# Patient Record
Sex: Female | Born: 1990 | Race: Black or African American | Hispanic: No | Marital: Single | Smoking: Current every day smoker
Health system: Southern US, Community
[De-identification: ages and names within clinical notes are randomized; demographics above are authoritative.]

## PROBLEM LIST (undated history)

## (undated) DIAGNOSIS — J45909 Unspecified asthma, uncomplicated: Secondary | ICD-10-CM

## (undated) DIAGNOSIS — J40 Bronchitis, not specified as acute or chronic: Secondary | ICD-10-CM

## (undated) DIAGNOSIS — K5090 Crohn's disease, unspecified, without complications: Secondary | ICD-10-CM

## (undated) DIAGNOSIS — C50919 Malignant neoplasm of unspecified site of unspecified female breast: Secondary | ICD-10-CM

## (undated) HISTORY — PX: PORTA CATH INSERTION: CATH118285

---

## 2014-06-25 ENCOUNTER — Encounter (HOSPITAL_BASED_OUTPATIENT_CLINIC_OR_DEPARTMENT_OTHER): Payer: Self-pay

## 2014-06-25 ENCOUNTER — Emergency Department (HOSPITAL_BASED_OUTPATIENT_CLINIC_OR_DEPARTMENT_OTHER)
Admission: EM | Admit: 2014-06-25 | Discharge: 2014-06-25 | Disposition: A | Discharge status: Home / Self Care | Payer: Medicaid Other | Attending: Emergency Medicine | Admitting: Emergency Medicine

## 2014-06-25 DIAGNOSIS — Z3A01 Less than 8 weeks gestation of pregnancy: Secondary | ICD-10-CM | POA: Insufficient documentation

## 2014-06-25 DIAGNOSIS — O99331 Smoking (tobacco) complicating pregnancy, first trimester: Secondary | ICD-10-CM | POA: Insufficient documentation

## 2014-06-25 DIAGNOSIS — F17210 Nicotine dependence, cigarettes, uncomplicated: Secondary | ICD-10-CM | POA: Diagnosis not present

## 2014-06-25 DIAGNOSIS — O2341 Unspecified infection of urinary tract in pregnancy, first trimester: Secondary | ICD-10-CM | POA: Diagnosis not present

## 2014-06-25 DIAGNOSIS — O9989 Other specified diseases and conditions complicating pregnancy, childbirth and the puerperium: Secondary | ICD-10-CM | POA: Diagnosis present

## 2014-06-25 DIAGNOSIS — N390 Urinary tract infection, site not specified: Secondary | ICD-10-CM

## 2014-06-25 LAB — URINALYSIS, ROUTINE W REFLEX MICROSCOPIC
BILIRUBIN URINE: NEGATIVE
Glucose, UA: NEGATIVE mg/dL
Ketones, ur: NEGATIVE mg/dL
Nitrite: NEGATIVE
PH: 7 (ref 5.0–8.0)
Protein, ur: 30 mg/dL — AB
SPECIFIC GRAVITY, URINE: 1.024 (ref 1.005–1.030)
Urobilinogen, UA: 1 mg/dL (ref 0.0–1.0)

## 2014-06-25 LAB — PREGNANCY, URINE: Preg Test, Ur: POSITIVE — AB

## 2014-06-25 LAB — HCG, QUANTITATIVE, PREGNANCY: hCG, Beta Chain, Quant, S: 50 m[IU]/mL — ABNORMAL HIGH (ref <5)

## 2014-06-25 LAB — URINE MICROSCOPIC-ADD ON

## 2014-06-25 MED ORDER — PHENAZOPYRIDINE HCL 100 MG PO TABS
100.0000 mg | ORAL_TABLET | Freq: Once | ORAL | Status: AC
  Administered 2014-06-25: 100 mg via ORAL
  Filled 2014-06-25: qty 1

## 2014-06-25 MED ORDER — PHENAZOPYRIDINE HCL 200 MG PO TABS: 200.0000 mg | ORAL_TABLET | Freq: Three times a day (TID) | ORAL | Status: DC

## 2014-06-25 MED ORDER — NITROFURANTOIN MONOHYD MACRO 100 MG PO CAPS
100.0000 mg | ORAL_CAPSULE | Freq: Once | ORAL | Status: AC
  Administered 2014-06-25: 100 mg via ORAL
  Filled 2014-06-25: qty 1

## 2014-06-25 MED ORDER — NITROFURANTOIN MONOHYD MACRO 100 MG PO CAPS: 100.0000 mg | ORAL_CAPSULE | Freq: Two times a day (BID) | ORAL | Status: DC

## 2014-06-25 NOTE — ED Notes (Signed)
Presents with urinary frequency for the past 24 hours, pt states worse today.

## 2014-06-25 NOTE — ED Notes (Signed)
Patient here with mild dysuria and urinary frequency x 2 days. Denies abdominal and back pain

## 2014-06-25 NOTE — Discharge Instructions (Signed)
Recheck with your primary care physician in 3-5 days, for additional pregnancy test. If your pregnancy test remains positive, please be reevaluated in emergency room, or with GYN.   Urinary Tract Infection Urinary tract infections (UTIs) can develop anywhere along your urinary tract. Your urinary tract is your body's drainage system for removing wastes and extra water. Your urinary tract includes two kidneys, two ureters, a bladder, and a urethra. Your kidneys are a pair of bean-shaped organs. Each kidney is about the size of your fist. They are located below your ribs, one on each side of your spine. CAUSES Infections are caused by microbes, which are microscopic organisms, including fungi, viruses, and bacteria. These organisms are so small that they can only be seen through a microscope. Bacteria are the microbes that most commonly cause UTIs. SYMPTOMS  Symptoms of UTIs may vary by age and gender of the patient and by the location of the infection. Symptoms in young women typically include a frequent and intense urge to urinate and a painful, burning feeling in the bladder or urethra during urination. Older women and men are more likely to be tired, shaky, and weak and have muscle aches and abdominal pain. A fever may mean the infection is in your kidneys. Other symptoms of a kidney infection include pain in your back or sides below the ribs, nausea, and vomiting. DIAGNOSIS To diagnose a UTI, your caregiver will ask you about your symptoms. Your caregiver also will ask to provide a urine sample. The urine sample will be tested for bacteria and white blood cells. White blood cells are made by your body to help fight infection. TREATMENT  Typically, UTIs can be treated with medication. Because most UTIs are caused by a bacterial infection, they usually can be treated with the use of antibiotics. The choice of antibiotic and length of treatment depend on your symptoms and the type of bacteria causing  your infection. HOME CARE INSTRUCTIONS  If you were prescribed antibiotics, take them exactly as your caregiver instructs you. Finish the medication even if you feel better after you have only taken some of the medication.  Drink enough water and fluids to keep your urine clear or pale yellow.  Avoid caffeine, tea, and carbonated beverages. They tend to irritate your bladder.  Empty your bladder often. Avoid holding urine for long periods of time.  Empty your bladder before and after sexual intercourse.  After a bowel movement, women should cleanse from front to back. Use each tissue only once. SEEK MEDICAL CARE IF:   You have back pain.  You develop a fever.  Your symptoms do not begin to resolve within 3 days. SEEK IMMEDIATE MEDICAL CARE IF:   You have severe back pain or lower abdominal pain.  You develop chills.  You have nausea or vomiting.  You have continued burning or discomfort with urination. MAKE SURE YOU:   Understand these instructions.  Will watch your condition.  Will get help right away if you are not doing well or get worse. Document Released: 10/30/2004 Document Revised: 07/22/2011 Document Reviewed: 02/28/2011 [facility] Patient Information 2015 [place], [place]. This information is not intended to replace advice given to you by your health care provider. Make sure you discuss any questions you have with your health care provider.

## 2014-06-25 NOTE — ED Provider Notes (Signed)
CSN: [id number]     Arrival date & time 06/25/14  1058 History   First MD Initiated Contact with Patient 06/25/14 1131     Chief Complaint  Patient presents with  . Dysuria      HPI  Patient presents for evaluation of "UTI". She states she's had burning with urination and frequency over the last 2-3 days "like every at the time of had an infection". When I inquire about possible pregnancy, bleeding, pain, fever, nausea, flank pain patient really doesn't respond to my questioning, she continues to just state "UTI".  However, when I state that her patient test is positive she becomes more attentive, actually moves the blanket from over her head, and will answer my questions. She states she had a therapeutic AB 426. Have bleeding for several weeks and has simple spotting now. Her other urinary symptoms and present for 2-3 days.s  History reviewed. No pertinent past medical history. History reviewed. No pertinent past surgical history. No family history on file. History  Substance Use Topics  . Smoking status: Current Every Day Smoker    Types: Cigarettes  . Smokeless tobacco: Not on file  . Alcohol Use: Not on file   OB History    No data available     Review of Systems  Constitutional: Negative for fever, chills, diaphoresis, appetite change and fatigue.  HENT: Negative for mouth sores, sore throat and trouble swallowing.   Eyes: Negative for visual disturbance.  Respiratory: Negative for cough, chest tightness, shortness of breath and wheezing.   Cardiovascular: Negative for chest pain.  Gastrointestinal: Negative for nausea, vomiting, abdominal pain, diarrhea and abdominal distention.  Endocrine: Negative for polydipsia, polyphagia and polyuria.  Genitourinary: Positive for dysuria, frequency and vaginal bleeding. Negative for hematuria.  Musculoskeletal: Negative for gait problem.  Skin: Negative for color change, pallor and rash.  Neurological: Negative for dizziness,  syncope, light-headedness and headaches.  Hematological: Does not bruise/bleed easily.  Psychiatric/Behavioral: Negative for behavioral problems and confusion.      Allergies  Bactrim  Home Medications   Prior to Admission medications   Medication Sig Start Date End Date Taking? Authorizing Provider  nitrofurantoin, macrocrystal-monohydrate, (MACROBID) 100 MG capsule Take 1 capsule (100 mg total) by mouth 2 (two) times daily. 06/25/14   Tanna Furry, MD  phenazopyridine (PYRIDIUM) 200 MG tablet Take 1 tablet (200 mg total) by mouth 3 (three) times daily. 06/25/14   Tanna Furry, MD   BP 117/77 mmHg  Pulse 93  Temp(Src) 98 F (36.7 C) (Oral)  Resp 18  Ht 5' 3"  (1.6 m)  Wt 175 lb (79.379 kg)  BMI 31.01 kg/m2  SpO2 99% Physical Exam  Constitutional: She is oriented to person, place, and time. She appears well-developed and well-nourished. No distress.  HENT:  Head: Normocephalic.  Eyes: Conjunctivae are normal. Pupils are equal, round, and reactive to light. No scleral icterus.  Neck: Normal range of motion. Neck supple. No thyromegaly present.  Cardiovascular: Normal rate and regular rhythm.  Exam reveals no gallop and no friction rub.   No murmur heard. Pulmonary/Chest: Effort normal and breath sounds normal. No respiratory distress. She has no wheezes. She has no rales.  Abdominal: Soft. Bowel sounds are normal. She exhibits no distension. There is no tenderness. There is no rebound.  Abdomen is soft, nondistended, nontender.  Musculoskeletal: Normal range of motion.  Neurological: She is alert and oriented to person, place, and time.  Skin: Skin is warm and dry. No rash noted.  Psychiatric:  She has a normal mood and affect. Her behavior is normal.    ED Course  Procedures (including critical care time) Labs Review Labs Reviewed  URINALYSIS, ROUTINE W REFLEX MICROSCOPIC - Abnormal; Notable for the following:    APPearance CLOUDY (*)    Hgb urine dipstick LARGE (*)     Protein, ur 30 (*)    Leukocytes, UA LARGE (*)    All other components within normal limits  PREGNANCY, URINE - Abnormal; Notable for the following:    Preg Test, Ur POSITIVE (*)    All other components within normal limits  URINE MICROSCOPIC-ADD ON - Abnormal; Notable for the following:    Bacteria, UA MANY (*)    All other components within normal limits  HCG, QUANTITATIVE, PREGNANCY - Abnormal; Notable for the following:    hCG, Beta Chain, Quant, S 50 (*)    All other components within normal limits    Imaging Review No results found.   EKG Interpretation None      MDM   Final diagnoses:  UTI (lower urinary tract infection)    Patient test is positive. Quant is 50. Had therapeutic AB 24 days ago. She states she was between 2 and 3 months at the time. Serum quantitative is 50. Theoretically this is a decreasing hCG from her recent therapeutic AB. She declines ultrasound or pelvic exam today. I will treat her for her UTI. A vascular repeat basic testing with her primary care physician in 3-5 days and to be reevaluated with GYN or in ER she develops pain, bleeding, or as her test remains positive.    Tanna Furry, MD 06/25/14 [phone]

## 2014-12-13 ENCOUNTER — Emergency Department (HOSPITAL_BASED_OUTPATIENT_CLINIC_OR_DEPARTMENT_OTHER)
Admission: EM | Admit: 2014-12-13 | Discharge: 2014-12-14 | Disposition: A | Discharge status: Home / Self Care | Payer: Medicaid Other | Attending: Emergency Medicine | Admitting: Emergency Medicine

## 2014-12-13 ENCOUNTER — Encounter (HOSPITAL_BASED_OUTPATIENT_CLINIC_OR_DEPARTMENT_OTHER): Payer: Self-pay | Admitting: *Deleted

## 2014-12-13 DIAGNOSIS — R1013 Epigastric pain: Secondary | ICD-10-CM | POA: Diagnosis present

## 2014-12-13 DIAGNOSIS — R05 Cough: Secondary | ICD-10-CM | POA: Diagnosis not present

## 2014-12-13 DIAGNOSIS — R102 Pelvic and perineal pain: Secondary | ICD-10-CM | POA: Insufficient documentation

## 2014-12-13 DIAGNOSIS — Z8742 Personal history of other diseases of the female genital tract: Secondary | ICD-10-CM | POA: Diagnosis not present

## 2014-12-13 DIAGNOSIS — Z720 Tobacco use: Secondary | ICD-10-CM | POA: Diagnosis not present

## 2014-12-13 DIAGNOSIS — Z8709 Personal history of other diseases of the respiratory system: Secondary | ICD-10-CM | POA: Insufficient documentation

## 2014-12-13 DIAGNOSIS — Z3202 Encounter for pregnancy test, result negative: Secondary | ICD-10-CM | POA: Diagnosis not present

## 2014-12-13 LAB — URINALYSIS, ROUTINE W REFLEX MICROSCOPIC
BILIRUBIN URINE: NEGATIVE
GLUCOSE, UA: NEGATIVE mg/dL
HGB URINE DIPSTICK: NEGATIVE
Ketones, ur: NEGATIVE mg/dL
Leukocytes, UA: NEGATIVE
Nitrite: NEGATIVE
PH: 6.5 (ref 5.0–8.0)
Protein, ur: NEGATIVE mg/dL
SPECIFIC GRAVITY, URINE: 1.023 (ref 1.005–1.030)
UROBILINOGEN UA: 0.2 mg/dL (ref 0.0–1.0)

## 2014-12-13 LAB — PREGNANCY, URINE: Preg Test, Ur: NEGATIVE

## 2014-12-13 NOTE — ED Notes (Signed)
C/o abd pain x 11 hours,  Denies n/v, no diarrhea,  No buring w urination,  States slight vad dc

## 2014-12-13 NOTE — ED Provider Notes (Signed)
CSN: [id number]     Arrival date & time 12/13/14  2319 History  By signing my name below, I, Arianna Nassar, attest that this documentation has been prepared under the direction and in the presence of Charlesetta Shanks, MD. Electronically Signed: Julien Nordmann, ED Scribe. 12/14/2014. 12:02 AM.    Chief Complaint  Patient presents with  . Abdominal Pain      The history is provided by the patient. No language interpreter was used.   HPI Comments: Jeanette Hines is a 24 y.o. female who presents to the Emergency Department complaining of constant, gradual worsening epigastric and suprapubic lower abdominal pain onset 12 hours ago. She has an associated productive cough due to a recent diagnosis of bronchitis. Pt notes using her inhaler earlier today to help alleviate the symptoms with minimal relief. Pt is currently sexually active, not on birth control and she has not missed a period. Pt denies nausea, vomiting, diarrhea, vaginal discharge, vaginal bleeding, dysuria, hematuria, back pain and fever.  History reviewed. No pertinent past medical history. History reviewed. No pertinent past surgical history. History reviewed. No pertinent family history. Social History  Substance Use Topics  . Smoking status: Current Every Day Smoker -- 0.50 packs/day    Types: Cigarettes  . Smokeless tobacco: None  . Alcohol Use: No   OB History    No data available     Review of Systems  10 Systems reviewed and are negative for acute change except as noted in the HPI.   Allergies  Bactrim  Home Medications   Prior to Admission medications   Medication Sig Start Date End Date Taking? Authorizing Provider  ibuprofen (ADVIL,MOTRIN) 800 MG tablet Take 1 tablet (800 mg total) by mouth 3 (three) times daily. 12/14/14   Charlesetta Shanks, MD   Triage vitals: BP 130/77 mmHg  Pulse 104  Temp(Src) 100 F (37.8 C)  Resp 16  Ht 5' 2"  (1.575 m)  Wt 184 lb (83.462 kg)  BMI 33.65 kg/m2  SpO2 100%  LMP  11/22/2014 Physical Exam  Constitutional: She is oriented to person, place, and time. She appears well-developed and well-nourished.  HENT:  Head: Normocephalic and atraumatic.  Eyes: EOM are normal. Pupils are equal, round, and reactive to light.  Neck: Neck supple.  Cardiovascular: Normal rate, regular rhythm, normal heart sounds and intact distal pulses.   Pulmonary/Chest: Effort normal and breath sounds normal.  Abdominal: Soft. Bowel sounds are normal. She exhibits no distension. There is no tenderness.  Genitourinary:  Normal external female genitalia. Back examination, moderate amount of whitish yellow discharge in the vaginal vault. Cervix without any bleeding or friability. Bimanual examination positive for cervical motion tenderness. Patient also endorses uterine tenderness to palpation. No pain over the right ovary to palpation. Patient denies pain higher once palpating out of the adnexa.  Musculoskeletal: Normal range of motion. She exhibits no edema.  Neurological: She is alert and oriented to person, place, and time. She has normal strength. Coordination normal. GCS eye subscore is 4. GCS verbal subscore is 5. GCS motor subscore is 6.  Skin: Skin is warm, dry and intact.  Psychiatric: She has a normal mood and affect.    ED Course  Procedures  DIAGNOSTIC STUDIES: Oxygen Saturation is 100% on RA, normal by my interpretation.  COORDINATION OF CARE:  12:01 AM Discussed treatment plan with pt at bedside and pt agreed to plan.  Labs Review Labs Reviewed  URINALYSIS, ROUTINE W REFLEX MICROSCOPIC (NOT AT [facility]) - Abnormal; Notable for  the following:    APPearance CLOUDY (*)    All other components within normal limits  WET PREP, GENITAL  PREGNANCY, URINE  GC/CHLAMYDIA PROBE AMP ([place]) NOT AT [facility]    Imaging Review No results found. I have personally reviewed and evaluated these images and lab results as part of my medical decision-making.   EKG  Interpretation None      MDM   Final diagnoses:  Pelvic pain in female   Patient presents with lower abdominal pain. On pelvic examination this does localize to the uterus and right adnexa. Patient does report a history of ovarian cysts with similar feeling. She also reports recent URI for which she is taking Tessalon Perle and inhaler. She does have clear pulmonary examination no signs rest. Distress. At this time I did not feel further diagnostic evaluation was needed for her respiratory symptoms. Her treatment was initiated based on a visit to [facility] within the past 2 days she reports. Her pelvic pain will be treated for possible cervicitis with tenderness and positive sexual activity. On bimanual examination the pain does localize to the pelvic structures and does not suggest appendicitis or other abdominal etiology. The patient poor she does have a gynecologist and is instructed to follow up next week for recheck.  Charlesetta Shanks, MD 12/14/14 [phone]

## 2014-12-13 NOTE — ED Notes (Signed)
Pt c/o lower abd pain x 12 hrs denies n/v/d

## 2014-12-14 LAB — GC/CHLAMYDIA PROBE AMP (~~LOC~~) NOT AT ARMC
CHLAMYDIA, DNA PROBE: POSITIVE — AB
Neisseria Gonorrhea: NEGATIVE

## 2014-12-14 LAB — WET PREP, GENITAL
Trich, Wet Prep: NONE SEEN
Yeast Wet Prep HPF POC: NONE SEEN

## 2014-12-14 MED ORDER — LIDOCAINE HCL (PF) 1 % IJ SOLN
INTRAMUSCULAR | Status: AC
  Administered 2014-12-14: 1.5 mL
  Filled 2014-12-14: qty 5

## 2014-12-14 MED ORDER — CEFTRIAXONE SODIUM 250 MG IJ SOLR
250.0000 mg | Freq: Once | INTRAMUSCULAR | Status: AC
  Administered 2014-12-14: 250 mg via INTRAMUSCULAR
  Filled 2014-12-14: qty 250

## 2014-12-14 MED ORDER — IBUPROFEN 800 MG PO TABS
800.0000 mg | ORAL_TABLET | Freq: Once | ORAL | Status: AC
  Administered 2014-12-14: 800 mg via ORAL
  Filled 2014-12-14: qty 1

## 2014-12-14 MED ORDER — AZITHROMYCIN 1 G PO PACK
1.0000 g | PACK | Freq: Once | ORAL | Status: AC
  Administered 2014-12-14: 1 g via ORAL
  Filled 2014-12-14: qty 1

## 2014-12-14 MED ORDER — IBUPROFEN 800 MG PO TABS: 800.0000 mg | ORAL_TABLET | Freq: Three times a day (TID) | ORAL | Status: DC

## 2014-12-14 NOTE — Discharge Instructions (Signed)
Pelvic Pain, Female Female pelvic pain can be caused by many different things and start from a variety of places. Pelvic pain refers to pain that is located in the lower half of the abdomen and between your hips. The pain may occur over a short period of time (acute) or may be reoccurring (chronic). The cause of pelvic pain may be related to disorders affecting the female reproductive organs (gynecologic), but it may also be related to the bladder, kidney stones, an intestinal complication, or muscle or skeletal problems. Getting help right away for pelvic pain is important, especially if there has been severe, sharp, or a sudden onset of unusual pain. It is also important to get help right away because some types of pelvic pain can be life threatening.  CAUSES  Below are only some of the causes of pelvic pain. The causes of pelvic pain can be in one of several categories.   Gynecologic.  Pelvic inflammatory disease.  Sexually transmitted infection.  Ovarian cyst or a twisted ovarian ligament (ovarian torsion).  Uterine lining that grows outside the uterus (endometriosis).  Fibroids, cysts, or tumors.  Ovulation.  Pregnancy.  Pregnancy that occurs outside the uterus (ectopic pregnancy).  Miscarriage.  Labor.  Abruption of the placenta or ruptured uterus.  Infection.  Uterine infection (endometritis).  Bladder infection.  Diverticulitis.  Miscarriage related to a uterine infection (septic abortion).  Bladder.  Inflammation of the bladder (cystitis).  Kidney stone(s).  Gastrointestinal.  Constipation.  Diverticulitis.  Neurologic.  Trauma.  Feeling pelvic pain because of mental or emotional causes (psychosomatic).  Cancers of the bowel or pelvis. EVALUATION  Your caregiver will want to take a careful history of your concerns. This includes recent changes in your health, a careful gynecologic history of your periods (menses), and a sexual history. Obtaining  your family history and medical history is also important. Your caregiver may suggest a pelvic exam. A pelvic exam will help identify the location and severity of the pain. It also helps in the evaluation of which organ system may be involved. In order to identify the cause of the pelvic pain and be properly treated, your caregiver may order tests. These tests may include:   A pregnancy test.  Pelvic ultrasonography.  An X-ray exam of the abdomen.  A urinalysis or evaluation of vaginal discharge.  Blood tests. HOME CARE INSTRUCTIONS   Only take over-the-counter or prescription medicines for pain, discomfort, or fever as directed by your caregiver.   Rest as directed by your caregiver.   Eat a balanced diet.   Drink enough fluids to make your urine clear or pale yellow, or as directed.   Avoid sexual intercourse if it causes pain.   Apply warm or cold compresses to the lower abdomen depending on which one helps the pain.   Avoid stressful situations.   Keep a journal of your pelvic pain. Write down when it started, where the pain is located, and if there are things that seem to be associated with the pain, such as food or your menstrual cycle.  Follow up with your caregiver as directed.  SEEK MEDICAL CARE IF:  Your medicine does not help your pain.  You have abnormal vaginal discharge. SEEK IMMEDIATE MEDICAL CARE IF:   You have heavy bleeding from the vagina.   Your pelvic pain increases.   You feel light-headed or faint.   You have chills.   You have pain with urination or blood in your urine.   You have uncontrolled  diarrhea or vomiting.   You have a fever or persistent symptoms for more than 3 days.  You have a fever and your symptoms suddenly get worse.   You are being physically or sexually abused.   This information is not intended to replace advice given to you by your health care provider. Make sure you discuss any questions you have with  your health care provider.   Document Released: 12/18/2003 Document Revised: 10/11/2014 Document Reviewed: 05/12/2011 Elsevier Interactive Patient Education [employer].

## 2014-12-15 ENCOUNTER — Telehealth (HOSPITAL_BASED_OUTPATIENT_CLINIC_OR_DEPARTMENT_OTHER): Payer: Self-pay | Admitting: Emergency Medicine

## 2014-12-17 ENCOUNTER — Telehealth (HOSPITAL_BASED_OUTPATIENT_CLINIC_OR_DEPARTMENT_OTHER): Payer: Self-pay | Admitting: Emergency Medicine

## 2015-01-08 ENCOUNTER — Telehealth (HOSPITAL_COMMUNITY): Payer: Self-pay

## 2015-01-08 NOTE — Telephone Encounter (Signed)
Unable to reach by phone or mail.  Chart closed.

## 2015-04-28 ENCOUNTER — Encounter (HOSPITAL_BASED_OUTPATIENT_CLINIC_OR_DEPARTMENT_OTHER): Payer: Self-pay | Admitting: Emergency Medicine

## 2015-04-28 ENCOUNTER — Emergency Department (HOSPITAL_BASED_OUTPATIENT_CLINIC_OR_DEPARTMENT_OTHER)
Admission: EM | Admit: 2015-04-28 | Discharge: 2015-04-28 | Disposition: A | Discharge status: Home / Self Care | Payer: Medicaid Other | Attending: Emergency Medicine | Admitting: Emergency Medicine

## 2015-04-28 DIAGNOSIS — W503XXA Accidental bite by another person, initial encounter: Secondary | ICD-10-CM

## 2015-04-28 DIAGNOSIS — Z23 Encounter for immunization: Secondary | ICD-10-CM | POA: Diagnosis not present

## 2015-04-28 DIAGNOSIS — Y998 Other external cause status: Secondary | ICD-10-CM | POA: Diagnosis not present

## 2015-04-28 DIAGNOSIS — Z791 Long term (current) use of non-steroidal anti-inflammatories (NSAID): Secondary | ICD-10-CM | POA: Diagnosis not present

## 2015-04-28 DIAGNOSIS — Y9389 Activity, other specified: Secondary | ICD-10-CM | POA: Diagnosis not present

## 2015-04-28 DIAGNOSIS — F17210 Nicotine dependence, cigarettes, uncomplicated: Secondary | ICD-10-CM | POA: Diagnosis not present

## 2015-04-28 DIAGNOSIS — S6992XA Unspecified injury of left wrist, hand and finger(s), initial encounter: Secondary | ICD-10-CM | POA: Diagnosis present

## 2015-04-28 DIAGNOSIS — Y9289 Other specified places as the place of occurrence of the external cause: Secondary | ICD-10-CM | POA: Insufficient documentation

## 2015-04-28 DIAGNOSIS — S61452A Open bite of left hand, initial encounter: Secondary | ICD-10-CM | POA: Diagnosis not present

## 2015-04-28 MED ORDER — TETANUS-DIPHTH-ACELL PERTUSSIS 5-2.5-18.5 LF-MCG/0.5 IM SUSP
0.5000 mL | Freq: Once | INTRAMUSCULAR | Status: AC
  Administered 2015-04-28: 0.5 mL via INTRAMUSCULAR
  Filled 2015-04-28: qty 0.5

## 2015-04-28 MED ORDER — AMOXICILLIN-POT CLAVULANATE 875-125 MG PO TABS: 1.0000 | ORAL_TABLET | Freq: Two times a day (BID) | ORAL | Status: DC

## 2015-04-28 MED ORDER — AMOXICILLIN-POT CLAVULANATE 875-125 MG PO TABS
1.0000 | ORAL_TABLET | Freq: Once | ORAL | Status: AC
  Administered 2015-04-28: 1 via ORAL
  Filled 2015-04-28: qty 1
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
## 2015-08-25 ENCOUNTER — Emergency Department (HOSPITAL_BASED_OUTPATIENT_CLINIC_OR_DEPARTMENT_OTHER)
Admission: EM | Admit: 2015-08-25 | Discharge: 2015-08-25 | Disposition: A | Discharge status: Home / Self Care | Payer: Medicaid Other | Attending: Emergency Medicine | Admitting: Emergency Medicine

## 2015-08-25 ENCOUNTER — Encounter (HOSPITAL_BASED_OUTPATIENT_CLINIC_OR_DEPARTMENT_OTHER): Payer: Self-pay | Admitting: *Deleted

## 2015-08-25 DIAGNOSIS — F17210 Nicotine dependence, cigarettes, uncomplicated: Secondary | ICD-10-CM | POA: Insufficient documentation

## 2015-08-25 DIAGNOSIS — S39011A Strain of muscle, fascia and tendon of abdomen, initial encounter: Secondary | ICD-10-CM | POA: Diagnosis not present

## 2015-08-25 DIAGNOSIS — T148XXA Other injury of unspecified body region, initial encounter: Secondary | ICD-10-CM

## 2015-08-25 DIAGNOSIS — Y9289 Other specified places as the place of occurrence of the external cause: Secondary | ICD-10-CM | POA: Diagnosis not present

## 2015-08-25 DIAGNOSIS — Y9341 Activity, dancing: Secondary | ICD-10-CM | POA: Insufficient documentation

## 2015-08-25 DIAGNOSIS — Y998 Other external cause status: Secondary | ICD-10-CM | POA: Diagnosis not present

## 2015-08-25 DIAGNOSIS — X58XXXA Exposure to other specified factors, initial encounter: Secondary | ICD-10-CM | POA: Diagnosis not present

## 2015-08-25 DIAGNOSIS — R1031 Right lower quadrant pain: Secondary | ICD-10-CM | POA: Diagnosis present

## 2015-08-25 LAB — PREGNANCY, URINE: PREG TEST UR: NEGATIVE

## 2015-08-25 MED ORDER — NAPROXEN 500 MG PO TABS: 500.0000 mg | ORAL_TABLET | Freq: Two times a day (BID) | ORAL | Status: DC

## 2015-08-25 NOTE — ED Notes (Signed)
Pt reports right sided groin area pain since dancing all night on her [place] at the club. Pt reports pain increases with certain movements. Denies any vag d/c or other c/o.

## 2015-08-25 NOTE — Discharge Instructions (Signed)
Take your medication as prescribed as aunt for pain relief. You may also apply ice to affected area for 10-15 minutes 3-4 times daily for pain relief. Refrain from doing any repetitive movements or movement that exacerbate your pain for the next few days until your pain has improved. Follow-up with your family doctor in the next week if her symptoms have not improved. Return to the emergency department if symptoms worsen or new onset of fever, vomiting, diarrhea, blood in vomit or stool, unable to keep fluids down, new or worsening abdominal pain, vaginal bleeding, vaginal discharge.

## 2015-08-25 NOTE — ED Provider Notes (Signed)
CSN: [id number]     Arrival date & time 08/25/15  1412 History   First MD Initiated Contact with Jeanette Hines 08/25/15 1454     Chief Complaint  Jeanette Hines presents with  . Groin Pain     (Consider location/radiation/quality/duration/timing/severity/associated sxs/prior Treatment) HPI   Jeanette Hines is a 25 year old female with no pertinent past medical history who presents the ED with complaint of right groin pain, onset yesterday morning. Jeanette Hines reports she was out dancing all night 2 nights ago at a club and notes after getting home the following morning she began having sharp aching pain to her right groin. Jeanette Hines reports pain is worse with movement, bending or sitting up. Jeanette Hines denies any other known injury. Denies taking any medications prior to arrival. Denies fever, chills, abdominal pain, nausea, vomiting, diarrhea, vaginal discharge, vaginal bleeding, urinary symptoms, flank pain. LMP the end of June, pt reports she is scheduled to start her menstrual cycle in the next few days. Denies any history of abdominal surgeries.  History reviewed. No pertinent past medical history. History reviewed. No pertinent past surgical history. History reviewed. No pertinent family history. Social History  Substance Use Topics  . Smoking status: Current Every Day Smoker -- 0.50 packs/day    Types: Cigarettes  . Smokeless tobacco: None  . Alcohol Use: No   OB History    No data available     Review of Systems  Musculoskeletal:       Right groin pain  All other systems reviewed and are negative.     Allergies  Bactrim  Home Medications   Prior to Admission medications   Medication Sig Start Date End Date Taking? Authorizing Provider  amoxicillin-clavulanate (AUGMENTIN) 875-125 MG tablet Take 1 tablet by mouth every 12 (twelve) hours. 04/28/15   Okey Regal, PA-C  ibuprofen (ADVIL,MOTRIN) 800 MG tablet Take 1 tablet (800 mg total) by mouth 3 (three) times daily. 12/14/14   Charlesetta Shanks,  MD  naproxen (NAPROSYN) 500 MG tablet Take 1 tablet (500 mg total) by mouth 2 (two) times daily. 08/25/15   Chesley Noon Jonmichael Beadnell, PA-C   BP 110/82 mmHg  Pulse 91  Temp(Src) 98.9 F (37.2 C) (Oral)  Resp 18  Ht 5' 2"  (1.575 m)  Wt 80.74 kg  BMI 32.55 kg/m2  SpO2 100%  LMP 08/02/2015 Physical Exam  Constitutional: She is oriented to person, place, and time. She appears well-developed and well-nourished. No distress.  HENT:  Head: Normocephalic and atraumatic.  Mouth/Throat: Oropharynx is clear and moist. No oropharyngeal exudate.  Eyes: Conjunctivae and EOM are normal. Right eye exhibits no discharge. Left eye exhibits no discharge. No scleral icterus.  Neck: Normal range of motion. Neck supple.  Cardiovascular: Normal rate, regular rhythm, normal heart sounds and intact distal pulses.   Pulmonary/Chest: Effort normal and breath sounds normal. No respiratory distress. She has no wheezes. She has no rales. She exhibits no tenderness.  Abdominal: Soft. Bowel sounds are normal. She exhibits no distension and no mass. There is no tenderness. There is no rebound and no guarding.  Musculoskeletal: Normal range of motion. She exhibits tenderness. She exhibits no edema.  TTP over right anterior superior groin along iliopsoas and inferior transversus abdominis muscles. FROM of bilateral hips and BLE. No swelling, abrasion or contusion noted.  Neurological: She is alert and oriented to person, place, and time.  Skin: Skin is warm and dry. She is not diaphoretic.  Nursing note and vitals reviewed.   ED Course  Procedures (including critical care time)  Labs Review Labs Reviewed  PREGNANCY, URINE    Imaging Review No results found. I have personally reviewed and evaluated these images and lab results as part of my medical decision-making.   EKG Interpretation None      MDM   Final diagnoses:  Muscle strain    Jeanette Hines presents with right groin pain that occurs with movement or  sitting up. Pain started after dancing at a club 2 nights ago. Denies any other known injury. Denies any other associated symptoms. VSS. Exam revealed tenderness over right anterior superior groin along iliopsoas and inferior transverse abdominis muscles, no abdominal tenderness, no peritoneal signs. Bilateral lower extremities are vascular intact. Jeanette Hines able to stand and ambulate without assistance. I suspect Jeanette Hines's symptoms are likely due to muscle strain. Plan to discharge Jeanette Hines home with NSAIDs and symptomatically treatment. Discussed plan for discharge with Jeanette Hines. Advised her to follow up with PCP as needed if symptoms have not improved over the next week. Discussed strict return precautions with Jeanette Hines.    Chesley Noon [place], [place] 08/25/15 1654   Leonard Schwartz, MD 08/26/15 [phone]

## 2015-09-17 ENCOUNTER — Emergency Department (HOSPITAL_BASED_OUTPATIENT_CLINIC_OR_DEPARTMENT_OTHER)
Admission: EM | Admit: 2015-09-17 | Discharge: 2015-09-17 | Disposition: A | Discharge status: Home / Self Care | Payer: Medicaid Other | Attending: Emergency Medicine | Admitting: Emergency Medicine

## 2015-09-17 ENCOUNTER — Emergency Department (HOSPITAL_BASED_OUTPATIENT_CLINIC_OR_DEPARTMENT_OTHER): Payer: Medicaid Other

## 2015-09-17 ENCOUNTER — Encounter (HOSPITAL_BASED_OUTPATIENT_CLINIC_OR_DEPARTMENT_OTHER): Payer: Self-pay | Admitting: Emergency Medicine

## 2015-09-17 DIAGNOSIS — F17210 Nicotine dependence, cigarettes, uncomplicated: Secondary | ICD-10-CM | POA: Diagnosis not present

## 2015-09-17 DIAGNOSIS — N72 Inflammatory disease of cervix uteri: Secondary | ICD-10-CM

## 2015-09-17 DIAGNOSIS — R1031 Right lower quadrant pain: Secondary | ICD-10-CM | POA: Diagnosis present

## 2015-09-17 DIAGNOSIS — N760 Acute vaginitis: Secondary | ICD-10-CM | POA: Diagnosis not present

## 2015-09-17 DIAGNOSIS — R102 Pelvic and perineal pain: Secondary | ICD-10-CM

## 2015-09-17 DIAGNOSIS — B9689 Other specified bacterial agents as the cause of diseases classified elsewhere: Secondary | ICD-10-CM

## 2015-09-17 LAB — URINALYSIS, ROUTINE W REFLEX MICROSCOPIC
Glucose, UA: NEGATIVE mg/dL
HGB URINE DIPSTICK: NEGATIVE
KETONES UR: 40 mg/dL — AB
Leukocytes, UA: NEGATIVE
NITRITE: NEGATIVE
Protein, ur: NEGATIVE mg/dL
SPECIFIC GRAVITY, URINE: 1.025 (ref 1.005–1.030)
pH: 6 (ref 5.0–8.0)

## 2015-09-17 LAB — LIPASE, BLOOD: Lipase: 21 U/L (ref 11–51)

## 2015-09-17 LAB — COMPREHENSIVE METABOLIC PANEL
ALBUMIN: 3.8 g/dL (ref 3.5–5.0)
ALK PHOS: 100 U/L (ref 38–126)
ALT: 12 U/L — ABNORMAL LOW (ref 14–54)
AST: 15 U/L (ref 15–41)
Anion gap: 8 (ref 5–15)
BILIRUBIN TOTAL: 0.7 mg/dL (ref 0.3–1.2)
BUN: 9 mg/dL (ref 6–20)
CO2: 26 mmol/L (ref 22–32)
Calcium: 9.1 mg/dL (ref 8.9–10.3)
Chloride: 106 mmol/L (ref 101–111)
Creatinine, Ser: 0.93 mg/dL (ref 0.44–1.00)
GFR calc Af Amer: >60 mL/min (ref >60)
GFR calc non Af Amer: >60 mL/min (ref >60)
GLUCOSE: 87 mg/dL (ref 65–99)
Potassium: 3.6 mmol/L (ref 3.5–5.1)
SODIUM: 140 mmol/L (ref 135–145)
TOTAL PROTEIN: 8 g/dL (ref 6.5–8.1)

## 2015-09-17 LAB — CBC
HEMATOCRIT: 37.5 % (ref 36.0–46.0)
HEMOGLOBIN: 12.3 g/dL (ref 12.0–15.0)
MCH: 26.9 pg (ref 26.0–34.0)
MCHC: 32.8 g/dL (ref 30.0–36.0)
MCV: 82.1 fL (ref 78.0–100.0)
Platelets: 469 10*3/uL — ABNORMAL HIGH (ref 150–400)
RBC: 4.57 MIL/uL (ref 3.87–5.11)
RDW: 15.1 % (ref 11.5–15.5)
WBC: 6.2 10*3/uL (ref 4.0–10.5)

## 2015-09-17 LAB — WET PREP, GENITAL
Sperm: NONE SEEN
TRICH WET PREP: NONE SEEN
YEAST WET PREP: NONE SEEN

## 2015-09-17 LAB — PREGNANCY, URINE: Preg Test, Ur: NEGATIVE

## 2015-09-17 MED ORDER — CEFTRIAXONE SODIUM 250 MG IJ SOLR
250.0000 mg | Freq: Once | INTRAMUSCULAR | Status: AC
  Administered 2015-09-17: 250 mg via INTRAMUSCULAR
  Filled 2015-09-17: qty 250

## 2015-09-17 MED ORDER — METRONIDAZOLE 500 MG PO TABS: 500.0000 mg | ORAL_TABLET | Freq: Two times a day (BID) | ORAL | 0 refills | Status: DC

## 2015-09-17 MED ORDER — LIDOCAINE HCL (PF) 1 % IJ SOLN
INTRAMUSCULAR | Status: AC
  Administered 2015-09-17: 0.9 mL
  Filled 2015-09-17: qty 5

## 2015-09-17 MED ORDER — AZITHROMYCIN 250 MG PO TABS
1000.0000 mg | ORAL_TABLET | Freq: Once | ORAL | Status: AC
  Administered 2015-09-17: 1000 mg via ORAL
  Filled 2015-09-17: qty 4

## 2015-09-17 MED FILL — metroNIDAZOLE 500 MG TABS: 500 | 7 days supply | Qty: 14 | Fill #0

## 2015-09-17 NOTE — ED Provider Notes (Signed)
[facility] DEPT Provider Note   CSN: [id number] Arrival date & time: 09/17/15  0605  First Provider Contact:  None       History   Chief Complaint Chief Complaint  Patient presents with  . Abdominal Pain    HPI Jeanette Hines is a 25 y.o. female. Pt presenting with c/o ongoing right sided lower abdominal pain.  Pt states the pain has been intermittent over the past month- she states she was seen in the ED several weeks ago for the same pain.  Notes from that visit discuss groin pain, however patient states she has been having right sided abdominal pain.  It has come and gone twice and then returned again this morning.  No fever/chills.  No dysuria.  No vaginal discharge or bleeding.  No vomiting or change in stools.  Pain is worse with movement.  There are no other associated systemic symptoms, there are no other alleviating or modifying factors.   HPI  History reviewed. No pertinent past medical history.  There are no active problems to display for this patient.   History reviewed. No pertinent surgical history.  OB History    No data available       Home Medications    Prior to Admission medications   Medication Sig Start Date End Date Taking? Authorizing Provider  amoxicillin-clavulanate (AUGMENTIN) 875-125 MG tablet Take 1 tablet by mouth every 12 (twelve) hours. 04/28/15   Okey Regal, PA-C  ibuprofen (ADVIL,MOTRIN) 800 MG tablet Take 1 tablet (800 mg total) by mouth 3 (three) times daily. 12/14/14   Charlesetta Shanks, MD  metroNIDAZOLE (FLAGYL) 500 MG tablet Take 1 tablet (500 mg total) by mouth 2 (two) times daily. 09/17/15   Alfonzo Beers, MD  naproxen (NAPROSYN) 500 MG tablet Take 1 tablet (500 mg total) by mouth 2 (two) times daily. 08/25/15   Nona Dell, PA-C    Family History History reviewed. No pertinent family history.  Social History Social History  Substance Use Topics  . Smoking status: Current Every Day Smoker    Packs/day: 0.50      Types: Cigarettes  . Smokeless tobacco: Never Used  . Alcohol use No     Allergies   Bactrim [sulfamethoxazole-trimethoprim]   Review of Systems Review of Systems ROS reviewed and all otherwise negative except for mentioned in HPI  Physical Exam Updated Vital Signs BP 115/78   Pulse 68   Temp 98.6 F (37 C) (Oral)   Resp 16   Ht 5' 2"  (1.575 m)   Wt 80.3 kg   LMP 08/27/2015   SpO2 100%   BMI 32.37 kg/m  Vitals reviewed Physical Exam  Physical Examination: General appearance - alert, well appearing, and in no distress Mental status - alert, oriented to person, place, and time Eyes -no conjunctival injection, no scleral icterus Chest - clear to auscultation, no wheezes, rales or rhonchi, symmetric air entry Heart - normal rate, regular rhythm, normal S1, S2, no murmurs, rubs, clicks or gallops Abdomen - soft, nontender, nondistended, no masses or organomegaly Pelvic - normal external genitalia, vulva, vagina, cervix, uterus, no CMT, mild right adnexal tenderness, no masses Neurological - alert, oriented Extremities - peripheral pulses normal, no pedal edema, no clubbing or cyanosis Skin - normal coloration and turgor, no rashes   ED Treatments / Results  Labs (all labs ordered are listed, but only abnormal results are displayed) Labs Reviewed  WET PREP, GENITAL - Abnormal; Notable for the following:  Result Value   Clue Cells Wet Prep HPF POC PRESENT (*)    WBC, Wet Prep HPF POC MANY (*)    All other components within normal limits  COMPREHENSIVE METABOLIC PANEL - Abnormal; Notable for the following:    ALT 12 (*)    All other components within normal limits  CBC - Abnormal; Notable for the following:    Platelets 469 (*)    All other components within normal limits  URINALYSIS, ROUTINE W REFLEX MICROSCOPIC (NOT AT [facility]) - Abnormal; Notable for the following:    APPearance CLOUDY (*)    Bilirubin Urine SMALL (*)    Ketones, ur 40 (*)    All other  components within normal limits  LIPASE, BLOOD  PREGNANCY, URINE  HIV ANTIBODY (ROUTINE TESTING)  RPR  GC/CHLAMYDIA PROBE AMP ([place]) NOT AT [facility]    EKG  EKG Interpretation None       Radiology US Transvaginal Non-ob  Result Date: 09/17/2015 CLINICAL DATA:  Right lower quadrant abdominal pain for approximately 3 weeks. History of ovarian cyst. EXAM: TRANSABDOMINAL AND TRANSVAGINAL ULTRASOUND OF PELVIS DOPPLER ULTRASOUND OF OVARIES TECHNIQUE: Both transabdominal and transvaginal ultrasound examinations of the pelvis were performed. Transabdominal technique was performed for global imaging of the pelvis including uterus, ovaries, adnexal regions, and pelvic cul-de-sac. It was necessary to proceed with endovaginal exam following the transabdominal exam to visualize the endometrium and ovaries to better advantage. Color and duplex Doppler ultrasound was utilized to evaluate blood flow to the ovaries. COMPARISON:  Report only from pelvic ultrasound 01/18/2011. Abdominal pelvic CT 01/18/2011. FINDINGS: Uterus Measurements: 6.8 x 2.8 x 3.3 cm. No fibroids or other mass visualized. Endometrium Thickness: 6 mm.  No focal abnormality visualized. Right ovary Measurements: 3.3 x 1.6 x 1.9 cm. Probable small central collapsing follicle, measuring 1.5 cm maximally. Normal blood flow with color Doppler. Left ovary Measurements: 3.2 x 1.3 x 1.3 cm. Normal appearance/no adnexal mass. Normal blood flow with color Doppler. Pulsed Doppler evaluation of both ovaries demonstrates normal low-resistance arterial and venous waveforms. Other findings Small to moderate amount of free pelvic fluid. IMPRESSION: 1. No evidence of ovarian torsion. 2. Probable small simple collapsing right ovarian follicle. No suspicious adnexal findings. 3. Small to moderate amount of free pelvic fluid, nonspecific. Electronically Signed   By: Richardean Sale M.D.   On: 09/17/2015 11:20   US Pelvis Complete  Result Date:  09/17/2015 CLINICAL DATA:  Right lower quadrant abdominal pain for approximately 3 weeks. History of ovarian cyst. EXAM: TRANSABDOMINAL AND TRANSVAGINAL ULTRASOUND OF PELVIS DOPPLER ULTRASOUND OF OVARIES TECHNIQUE: Both transabdominal and transvaginal ultrasound examinations of the pelvis were performed. Transabdominal technique was performed for global imaging of the pelvis including uterus, ovaries, adnexal regions, and pelvic cul-de-sac. It was necessary to proceed with endovaginal exam following the transabdominal exam to visualize the endometrium and ovaries to better advantage. Color and duplex Doppler ultrasound was utilized to evaluate blood flow to the ovaries. COMPARISON:  Report only from pelvic ultrasound 01/18/2011. Abdominal pelvic CT 01/18/2011. FINDINGS: Uterus Measurements: 6.8 x 2.8 x 3.3 cm. No fibroids or other mass visualized. Endometrium Thickness: 6 mm.  No focal abnormality visualized. Right ovary Measurements: 3.3 x 1.6 x 1.9 cm. Probable small central collapsing follicle, measuring 1.5 cm maximally. Normal blood flow with color Doppler. Left ovary Measurements: 3.2 x 1.3 x 1.3 cm. Normal appearance/no adnexal mass. Normal blood flow with color Doppler. Pulsed Doppler evaluation of both ovaries demonstrates normal low-resistance arterial and venous waveforms. Other  findings Small to moderate amount of free pelvic fluid. IMPRESSION: 1. No evidence of ovarian torsion. 2. Probable small simple collapsing right ovarian follicle. No suspicious adnexal findings. 3. Small to moderate amount of free pelvic fluid, nonspecific. Electronically Signed   By: Richardean Sale M.D.   On: 09/17/2015 11:20   [place] Art/ven Flow Abd Pelv Doppler  Result Date: 09/17/2015 CLINICAL DATA:  Right lower quadrant abdominal pain for approximately 3 weeks. History of ovarian cyst. EXAM: TRANSABDOMINAL AND TRANSVAGINAL ULTRASOUND OF PELVIS DOPPLER ULTRASOUND OF OVARIES TECHNIQUE: Both transabdominal and transvaginal  ultrasound examinations of the pelvis were performed. Transabdominal technique was performed for global imaging of the pelvis including uterus, ovaries, adnexal regions, and pelvic cul-de-sac. It was necessary to proceed with endovaginal exam following the transabdominal exam to visualize the endometrium and ovaries to better advantage. Color and duplex Doppler ultrasound was utilized to evaluate blood flow to the ovaries. COMPARISON:  Report only from pelvic ultrasound 01/18/2011. Abdominal pelvic CT 01/18/2011. FINDINGS: Uterus Measurements: 6.8 x 2.8 x 3.3 cm. No fibroids or other mass visualized. Endometrium Thickness: 6 mm.  No focal abnormality visualized. Right ovary Measurements: 3.3 x 1.6 x 1.9 cm. Probable small central collapsing follicle, measuring 1.5 cm maximally. Normal blood flow with color Doppler. Left ovary Measurements: 3.2 x 1.3 x 1.3 cm. Normal appearance/no adnexal mass. Normal blood flow with color Doppler. Pulsed Doppler evaluation of both ovaries demonstrates normal low-resistance arterial and venous waveforms. Other findings Small to moderate amount of free pelvic fluid. IMPRESSION: 1. No evidence of ovarian torsion. 2. Probable small simple collapsing right ovarian follicle. No suspicious adnexal findings. 3. Small to moderate amount of free pelvic fluid, nonspecific. Electronically Signed   By: Richardean Sale M.D.   On: 09/17/2015 11:20    Procedures Procedures (including critical care time)  Medications Ordered in ED Medications  cefTRIAXone (ROCEPHIN) injection 250 mg (250 mg Intramuscular Given 09/17/15 1159)  azithromycin (ZITHROMAX) tablet 1,000 mg (1,000 mg Oral Given 09/17/15 1158)  lidocaine (PF) (XYLOCAINE) 1 % injection (0.9 mLs  Given 09/17/15 1200)     Initial Impression / Assessment and Plan / ED Course  I have reviewed the triage vital signs and the nursing notes.  Pertinent labs & imaging results that were available during my care of the patient were  reviewed by me and considered in my medical decision making (see chart for details).  Clinical Course    Pt presenting with right sided pelvic pain that has been ongoing for the past several weeks.  Pelvic ultrasound is reassuring, wet prep is c/w BV and cervicitis.  Pt treated with rocephin, zithromax and given rx for flagyl.  Pt is not pregnant, no sign of ovarian torsion/TOA or other acute abnormalities on ultrasound.  Discharged with strict return precautions.  Pt agreeable with plan.  Final Clinical Impressions(s) / ED Diagnoses   Final diagnoses:  Pelvic pain in female  Cervicitis  Bacterial vaginosis    New Prescriptions Discharge Medication List as of 09/17/2015 12:22 PM    START taking these medications   Details  metroNIDAZOLE (FLAGYL) 500 MG tablet Take 1 tablet (500 mg total) by mouth 2 (two) times daily., Starting Mon 09/17/2015, Print         Alfonzo Beers, MD 09/18/15 1022

## 2015-09-17 NOTE — ED Triage Notes (Addendum)
Patient states that she was seen here on the 22nd and was told that if she had pain to her right lower quad area. Patient states that she continues to have the pain after it went away x 1. The patient reports that she was unable to even walk due to the pain. Patient seen in no distress while walking to the room. Patient texting on phone while triage going on.  Patient reports that her lower abdominal area is throbbing and it hurts worse when she turns over, moves and sometimes when she is sitting still it just throbbs, Patient reports that she also has a rash to her right neck that she states appeared after she sprayed some new body spray.

## 2015-09-17 NOTE — Discharge Instructions (Signed)
Return to the ED with any concerns including fever, vomiting and not able to keep down liquids or medications, fainting, worsening pain, decreased level of alertness/lethargy, or any other alarming symptoms

## 2015-09-18 LAB — RPR: RPR Ser Ql: NONREACTIVE

## 2015-09-18 LAB — GC/CHLAMYDIA PROBE AMP (~~LOC~~) NOT AT ARMC
CHLAMYDIA, DNA PROBE: NEGATIVE
NEISSERIA GONORRHEA: NEGATIVE

## 2015-09-18 LAB — HIV ANTIBODY (ROUTINE TESTING W REFLEX): HIV Screen 4th Generation wRfx: NONREACTIVE

## 2016-12-16 ENCOUNTER — Encounter (HOSPITAL_BASED_OUTPATIENT_CLINIC_OR_DEPARTMENT_OTHER): Payer: Self-pay | Admitting: Emergency Medicine

## 2016-12-16 ENCOUNTER — Other Ambulatory Visit: Payer: Self-pay

## 2016-12-16 ENCOUNTER — Emergency Department (HOSPITAL_BASED_OUTPATIENT_CLINIC_OR_DEPARTMENT_OTHER)
Admission: EM | Admit: 2016-12-16 | Discharge: 2016-12-16 | Disposition: A | Discharge status: Home / Self Care | Payer: Medicaid Other | Attending: Physician Assistant | Admitting: Physician Assistant

## 2016-12-16 DIAGNOSIS — B9689 Other specified bacterial agents as the cause of diseases classified elsewhere: Secondary | ICD-10-CM

## 2016-12-16 DIAGNOSIS — Z79899 Other long term (current) drug therapy: Secondary | ICD-10-CM | POA: Insufficient documentation

## 2016-12-16 DIAGNOSIS — N760 Acute vaginitis: Secondary | ICD-10-CM | POA: Insufficient documentation

## 2016-12-16 DIAGNOSIS — F17210 Nicotine dependence, cigarettes, uncomplicated: Secondary | ICD-10-CM | POA: Insufficient documentation

## 2016-12-16 DIAGNOSIS — A5901 Trichomonal vulvovaginitis: Secondary | ICD-10-CM | POA: Diagnosis not present

## 2016-12-16 DIAGNOSIS — A599 Trichomoniasis, unspecified: Secondary | ICD-10-CM

## 2016-12-16 DIAGNOSIS — N898 Other specified noninflammatory disorders of vagina: Secondary | ICD-10-CM | POA: Diagnosis present

## 2016-12-16 LAB — URINALYSIS, ROUTINE W REFLEX MICROSCOPIC
BILIRUBIN URINE: NEGATIVE
Glucose, UA: NEGATIVE mg/dL
KETONES UR: NEGATIVE mg/dL
NITRITE: NEGATIVE
Protein, ur: NEGATIVE mg/dL
Specific Gravity, Urine: 1.025 (ref 1.005–1.030)
pH: 6.5 (ref 5.0–8.0)

## 2016-12-16 LAB — WET PREP, GENITAL
SPERM: NONE SEEN
YEAST WET PREP: NONE SEEN

## 2016-12-16 LAB — URINALYSIS, MICROSCOPIC (REFLEX)

## 2016-12-16 LAB — PREGNANCY, URINE: PREG TEST UR: NEGATIVE

## 2016-12-16 MED ORDER — METRONIDAZOLE 500 MG PO TABS: 500.0000 mg | ORAL_TABLET | Freq: Two times a day (BID) | ORAL | 0 refills | Status: DC

## 2016-12-16 MED ORDER — AEROCHAMBER PLUS FLO-VU MEDIUM MISC
1.0000 | Freq: Once | Status: DC
  Filled 2016-12-16: qty 1

## 2016-12-16 MED ORDER — IPRATROPIUM-ALBUTEROL 0.5-2.5 (3) MG/3ML IN SOLN: 3.0000 mL | Freq: Once | RESPIRATORY_TRACT | Status: DC

## 2016-12-16 MED ORDER — ALBUTEROL SULFATE HFA 108 (90 BASE) MCG/ACT IN AERS
2.0000 | INHALATION_SPRAY | Freq: Once | RESPIRATORY_TRACT | Status: AC
  Administered 2016-12-16: 2 via RESPIRATORY_TRACT
  Filled 2016-12-16: qty 6.7

## 2016-12-16 MED ORDER — METRONIDAZOLE 500 MG PO TABS
500.0000 mg | ORAL_TABLET | Freq: Once | ORAL | Status: AC
  Administered 2016-12-16: 500 mg via ORAL
  Filled 2016-12-16: qty 1

## 2016-12-16 MED ORDER — ALBUTEROL SULFATE HFA 108 (90 BASE) MCG/ACT IN AERS: 2.0000 | INHALATION_SPRAY | Freq: Once | RESPIRATORY_TRACT | Status: DC

## 2016-12-16 MED FILL — metroNIDAZOLE 500 MG TABS: 500 | 7 days supply | Qty: 14 | Fill #0

## 2016-12-16 NOTE — ED Notes (Signed)
ED Provider at bedside. 

## 2016-12-16 NOTE — ED Triage Notes (Addendum)
Vaginal discharge and foul odor x 1 week. Also states she can't stop coughing since this morning.

## 2016-12-16 NOTE — ED Provider Notes (Signed)
[facility] EMERGENCY DEPARTMENT Provider Note   CSN: [id number] Arrival date & time: 12/16/16  0736     History   Chief Complaint Chief Complaint  Patient presents with  . Vaginal Discharge  . Cough    HPI Jeanette Hines is a 26 y.o. female.  HPI   Patient is a 26 year old female presenting with smelly vaginal discharge.  Patient reports it has been going over the last several days.  She reports she is been changing her soap a lot.  Denies douching. Endorses only one sexual contact.  History reviewed. No pertinent past medical history.  There are no active problems to display for this patient.   History reviewed. No pertinent surgical history.  OB History    No data available       Home Medications    Prior to Admission medications   Medication Sig Start Date End Date Taking? Authorizing Provider  amoxicillin-clavulanate (AUGMENTIN) 875-125 MG tablet Take 1 tablet by mouth every 12 (twelve) hours. 04/28/15   Hedges, Dellis Filbert, PA-C  ibuprofen (ADVIL,MOTRIN) 800 MG tablet Take 1 tablet (800 mg total) by mouth 3 (three) times daily. 12/14/14   Charlesetta Shanks, MD  metroNIDAZOLE (FLAGYL) 500 MG tablet Take 1 tablet (500 mg total) by mouth 2 (two) times daily. 09/17/15   Mabe, Forbes Cellar, MD  naproxen (NAPROSYN) 500 MG tablet Take 1 tablet (500 mg total) by mouth 2 (two) times daily. 08/25/15   Nona Dell, PA-C    Family History No family history on file.  Social History Social History   Tobacco Use  . Smoking status: Current Every Day Smoker    Packs/day: 0.50    Types: Cigarettes  . Smokeless tobacco: Never Used  Substance Use Topics  . Alcohol use: No  . Drug use: No     Allergies   Bactrim [sulfamethoxazole-trimethoprim]   Review of Systems Review of Systems  Constitutional: Negative for activity change.  Respiratory: Negative for shortness of breath.   Cardiovascular: Negative for chest pain.  Gastrointestinal: Negative  for abdominal pain.  Genitourinary: Positive for vaginal discharge. Negative for decreased urine volume, dysuria, menstrual problem, pelvic pain, urgency and vaginal bleeding.     Physical Exam Updated Vital Signs BP (!) 129/94 (BP Location: Right Arm)   Pulse 77   Temp 98.2 F (36.8 C) (Oral)   Resp 18   Ht 5' 2"  (1.575 m)   Wt 81.6 kg (180 lb)   LMP 12/09/2016   SpO2 100%   BMI 32.92 kg/m   Physical Exam  Constitutional: She is oriented to person, place, and time. She appears well-developed and well-nourished.  HENT:  Head: Normocephalic and atraumatic.  Eyes: Right eye exhibits no discharge.  Cardiovascular: Normal rate, regular rhythm and normal heart sounds.  No murmur heard. Pulmonary/Chest: Effort normal and breath sounds normal. She has no wheezes. She has no rales.  Abdominal: Soft. She exhibits no distension. There is no tenderness.  Genitourinary: Vagina normal.  Genitourinary Comments: Thin yellow vaginal discharge.  Malodorous.  Neurological: She is oriented to person, place, and time.  Skin: Skin is warm and dry. She is not diaphoretic.  Psychiatric: She has a normal mood and affect.  Nursing note and vitals reviewed.    ED Treatments / Results  Labs (all labs ordered are listed, but only abnormal results are displayed) Labs Reviewed  URINALYSIS, ROUTINE W REFLEX MICROSCOPIC - Abnormal; Notable for the following components:      Result Value   Hgb  urine dipstick TRACE (*)    Leukocytes, UA LARGE (*)    All other components within normal limits  URINALYSIS, MICROSCOPIC (REFLEX) - Abnormal; Notable for the following components:   Bacteria, UA MANY (*)    Squamous Epithelial / LPF 6-30 (*)    All other components within normal limits  WET PREP, GENITAL  PREGNANCY, URINE  GC/CHLAMYDIA PROBE AMP ([place]) NOT AT [facility]    EKG  EKG Interpretation None       Radiology No results found.  Procedures Procedures (including critical care  time)  Medications Ordered in ED Medications - No data to display   Initial Impression / Assessment and Plan / ED Course  I have reviewed the triage vital signs and the nursing notes.  Pertinent labs & imaging results that were available during my care of the patient were reviewed by me and considered in my medical decision making (see chart for details).     Patient is a 26 year old female presenting with smelly vaginal discharge.  Patient reports it has been going over the last several days.  She reports she is been changing her soap a lot.  Denies douching. Endorses only one sexual contact.  Patient has trichomoniasis and BV.  We will treat appropriately.  Final Clinical Impressions(s) / ED Diagnoses   Final diagnoses:  None    ED Discharge Orders    None       Macarthur Critchley, MD 12/16/16 0901

## 2016-12-17 LAB — GC/CHLAMYDIA PROBE AMP (~~LOC~~) NOT AT ARMC
CHLAMYDIA, DNA PROBE: NEGATIVE
Neisseria Gonorrhea: NEGATIVE

## 2019-06-14 ENCOUNTER — Emergency Department (HOSPITAL_BASED_OUTPATIENT_CLINIC_OR_DEPARTMENT_OTHER)
Admission: EM | Admit: 2019-06-14 | Discharge: 2019-06-14 | Disposition: A | Discharge status: Home / Self Care | Payer: Medicaid Other | Attending: Emergency Medicine | Admitting: Emergency Medicine

## 2019-06-14 ENCOUNTER — Emergency Department (HOSPITAL_BASED_OUTPATIENT_CLINIC_OR_DEPARTMENT_OTHER): Payer: Medicaid Other

## 2019-06-14 ENCOUNTER — Other Ambulatory Visit: Payer: Self-pay

## 2019-06-14 ENCOUNTER — Encounter (HOSPITAL_BASED_OUTPATIENT_CLINIC_OR_DEPARTMENT_OTHER): Payer: Self-pay

## 2019-06-14 DIAGNOSIS — Z882 Allergy status to sulfonamides status: Secondary | ICD-10-CM | POA: Diagnosis not present

## 2019-06-14 DIAGNOSIS — R109 Unspecified abdominal pain: Secondary | ICD-10-CM | POA: Diagnosis present

## 2019-06-14 DIAGNOSIS — F17210 Nicotine dependence, cigarettes, uncomplicated: Secondary | ICD-10-CM | POA: Diagnosis not present

## 2019-06-14 DIAGNOSIS — K50113 Crohn's disease of large intestine with fistula: Secondary | ICD-10-CM | POA: Insufficient documentation

## 2019-06-14 HISTORY — DX: Crohn's disease, unspecified, without complications: K50.90

## 2019-06-14 LAB — COMPREHENSIVE METABOLIC PANEL
ALT: 16 U/L (ref 0–44)
AST: 17 U/L (ref 15–41)
Albumin: 3.7 g/dL (ref 3.5–5.0)
Alkaline Phosphatase: 81 U/L (ref 38–126)
Anion gap: 9 (ref 5–15)
BUN: 10 mg/dL (ref 6–20)
CO2: 24 mmol/L (ref 22–32)
Calcium: 9.1 mg/dL (ref 8.9–10.3)
Chloride: 103 mmol/L (ref 98–111)
Creatinine, Ser: 0.75 mg/dL (ref 0.44–1.00)
GFR calc Af Amer: >60 mL/min (ref >60)
GFR calc non Af Amer: >60 mL/min (ref >60)
Glucose, Bld: 102 mg/dL — ABNORMAL HIGH (ref 70–99)
Potassium: 3.9 mmol/L (ref 3.5–5.1)
Sodium: 136 mmol/L (ref 135–145)
Total Bilirubin: 0.3 mg/dL (ref 0.3–1.2)
Total Protein: 8.1 g/dL (ref 6.5–8.1)

## 2019-06-14 LAB — CBC
HCT: 38.5 % (ref 36.0–46.0)
Hemoglobin: 12.5 g/dL (ref 12.0–15.0)
MCH: 28.3 pg (ref 26.0–34.0)
MCHC: 32.5 g/dL (ref 30.0–36.0)
MCV: 87.1 fL (ref 80.0–100.0)
Platelets: 391 10*3/uL (ref 150–400)
RBC: 4.42 MIL/uL (ref 3.87–5.11)
RDW: 14.6 % (ref 11.5–15.5)
WBC: 5.9 10*3/uL (ref 4.0–10.5)
nRBC: 0 % (ref 0.0–0.2)

## 2019-06-14 LAB — URINALYSIS, MICROSCOPIC (REFLEX)

## 2019-06-14 LAB — LIPASE, BLOOD: Lipase: 24 U/L (ref 11–51)

## 2019-06-14 LAB — URINALYSIS, ROUTINE W REFLEX MICROSCOPIC
Bilirubin Urine: NEGATIVE
Glucose, UA: NEGATIVE mg/dL
Hgb urine dipstick: NEGATIVE
Ketones, ur: NEGATIVE mg/dL
Nitrite: NEGATIVE
Protein, ur: NEGATIVE mg/dL
Specific Gravity, Urine: >1.03 — ABNORMAL HIGH (ref 1.005–1.030)
pH: 6 (ref 5.0–8.0)

## 2019-06-14 LAB — PREGNANCY, URINE: Preg Test, Ur: NEGATIVE

## 2019-06-14 MED ORDER — ONDANSETRON HCL 4 MG/2ML IJ SOLN
4.0000 mg | Freq: Once | INTRAMUSCULAR | Status: AC
  Administered 2019-06-14: 16:00:00 4 mg via INTRAVENOUS
  Filled 2019-06-14: qty 2

## 2019-06-14 MED ORDER — ONDANSETRON 4 MG PO TBDP
ORAL_TABLET | ORAL | 0 refills | Status: DC
Start: 2019-06-14 — End: 2021-05-03

## 2019-06-14 MED ORDER — SODIUM CHLORIDE 0.9 % IV BOLUS
1000.0000 mL | Freq: Once | INTRAVENOUS | Status: AC
  Administered 2019-06-14: 1000 mL via INTRAVENOUS

## 2019-06-14 MED ORDER — METHYLPREDNISOLONE SODIUM SUCC 125 MG IJ SOLR
125.0000 mg | Freq: Once | INTRAMUSCULAR | Status: AC
  Administered 2019-06-14: 125 mg via INTRAVENOUS
  Filled 2019-06-14: qty 2

## 2019-06-14 MED ORDER — SODIUM CHLORIDE 0.9% FLUSH
3.0000 mL | Freq: Once | INTRAVENOUS | Status: DC
  Filled 2019-06-14: qty 3

## 2019-06-14 MED ORDER — PREDNISONE 20 MG PO TABS
ORAL_TABLET | ORAL | 0 refills | Status: DC
Start: 2019-06-14 — End: 2021-02-08

## 2019-06-14 MED ORDER — IOHEXOL 300 MG/ML  SOLN
100.0000 mL | Freq: Once | INTRAMUSCULAR | Status: AC | PRN
  Administered 2019-06-14: 100 mL via INTRAVENOUS

## 2019-06-14 NOTE — ED Notes (Signed)
Pt back from CT stating she doesn't want the rest of the IV fluids ( ~ 500 ss )

## 2019-06-14 NOTE — ED Provider Notes (Signed)
[facility] EMERGENCY DEPARTMENT Provider Note   CSN: [id number] Arrival date & time: 06/14/19  1427     History Chief Complaint  Patient presents with  . Abdominal Pain    Rasheda Ledger is a 29 y.o. female hx of Crohn's on Infliximab influsions, here presenting with abdominal pain. Patient did go to the beach recently and started having nausea and diffuse abdominal pain for several days.  Denies any diarrhea or vomiting or fevers .  Patient has been compliant with her infusions.  Patient was concerned that she may have a Crohn's flare.  Patient denies any previous surgeries for Crohn or history of bowel obstructions.  Patient states that no other family members are sick.  The history is provided by the patient.       Past Medical History:  Diagnosis Date  . Crohn disease ([place])     There are no problems to display for this patient.   History reviewed. No pertinent surgical history.   OB History   No obstetric history on file.     No family history on file.  Social History   Tobacco Use  . Smoking status: Current Every Day Smoker    Packs/day: 0.50    Types: Cigarettes  . Smokeless tobacco: Never Used  Substance Use Topics  . Alcohol use: Yes    Comment: occ  . Drug use: No    Home Medications Prior to Admission medications   Medication Sig Start Date End Date Taking? Authorizing Provider  amoxicillin-clavulanate (AUGMENTIN) 875-125 MG tablet Take 1 tablet by mouth every 12 (twelve) hours. 04/28/15   Hedges, Dellis Filbert, PA-C  ibuprofen (ADVIL,MOTRIN) 800 MG tablet Take 1 tablet (800 mg total) by mouth 3 (three) times daily. 12/14/14   Charlesetta Shanks, MD  metroNIDAZOLE (FLAGYL) 500 MG tablet Take 1 tablet (500 mg total) by mouth 2 (two) times daily. 09/17/15   Mabe, Forbes Cellar, MD  metroNIDAZOLE (FLAGYL) 500 MG tablet Take 1 tablet (500 mg total) 2 (two) times daily by mouth. 12/16/16   Mackuen, Courteney Lyn, MD  naproxen (NAPROSYN) 500 MG tablet Take  1 tablet (500 mg total) by mouth 2 (two) times daily. 08/25/15   Nona Dell, PA-C    Allergies    Bactrim [sulfamethoxazole-trimethoprim]  Review of Systems   Review of Systems  Gastrointestinal: Positive for abdominal pain and nausea.  All other systems reviewed and are negative.   Physical Exam Updated Vital Signs BP 113/74 (BP Location: Right Arm)   Pulse 65   Temp 98.3 F (36.8 C) (Oral)   Resp 16   Ht 5' 2"  (1.575 m)   Wt 87 kg   LMP 05/29/2019   SpO2 100%   BMI 35.08 kg/m   Physical Exam Vitals and nursing note reviewed.  Constitutional:      Appearance: She is well-developed.  HENT:     Head: Normocephalic.     Mouth/Throat:     Mouth: Mucous membranes are moist.  Eyes:     Extraocular Movements: Extraocular movements intact.  Cardiovascular:     Rate and Rhythm: Normal rate and regular rhythm.     Heart sounds: Normal heart sounds.  Pulmonary:     Effort: Pulmonary effort is normal.     Breath sounds: Normal breath sounds.  Abdominal:     General: Abdomen is flat.     Comments: Mild epigastric tenderness, no rebound or guarding   Skin:    General: Skin is warm.  Capillary Refill: Capillary refill takes less than 2 seconds.  Neurological:     General: No focal deficit present.     Mental Status: She is alert.  Psychiatric:        Mood and Affect: Mood normal.        Behavior: Behavior normal.     ED Results / Procedures / Treatments   Labs (all labs ordered are listed, but only abnormal results are displayed) Labs Reviewed  COMPREHENSIVE METABOLIC PANEL - Abnormal; Notable for the following components:      Result Value   Glucose, Bld 102 (*)    All other components within normal limits  URINALYSIS, ROUTINE W REFLEX MICROSCOPIC - Abnormal; Notable for the following components:   APPearance HAZY (*)    Specific Gravity, Urine >1.030 (*)    Leukocytes,Ua TRACE (*)    All other components within normal limits  URINALYSIS,  MICROSCOPIC (REFLEX) - Abnormal; Notable for the following components:   Bacteria, UA MANY (*)    All other components within normal limits  LIPASE, BLOOD  CBC  PREGNANCY, URINE    EKG None  Radiology CT ABDOMEN PELVIS W CONTRAST  Result Date: 06/14/2019 CLINICAL DATA:  Abdominal pain and distension for several hours EXAM: CT ABDOMEN AND PELVIS WITH CONTRAST TECHNIQUE: Multidetector CT imaging of the abdomen and pelvis was performed using the standard protocol following bolus administration of intravenous contrast. CONTRAST:  138m OMNIPAQUE IOHEXOL 300 MG/ML  SOLN COMPARISON:  10/01/2018 FINDINGS: Lower chest: No acute abnormality. Hepatobiliary: No focal liver abnormality is seen. No gallstones, gallbladder wall thickening, or biliary dilatation. Pancreas: Unremarkable. No pancreatic ductal dilatation or surrounding inflammatory changes. Spleen: Normal in size without focal abnormality. Adrenals/Urinary Tract: Adrenal glands are within normal limits. The kidneys demonstrate a normal enhancement pattern. No renal calculi are noted. Small right renal cyst is again seen and stable. No obstructive changes are seen. The bladder is partially distended. Stomach/Bowel: Colon shows scattered diverticular change without evidence of diverticulitis. No obstructive changes are seen. In the area of the terminal ileum and proximal ascending colon, there is some mild wall thickening as well as pericolonic inflammatory change. Additionally there is some angulation and kinking of bowel loops in the distal ileum similar to that seen on the prior exam consistent with the patient's given clinical history of underlying Crohn's disease. There is suggestion of small fistula again present. No definitive obstructive changes are seen although some fecalization of small bowel contents is noted similar to that seen on the prior exam indicating some decreased motility. Few scattered lymph nodes are again identified similar to  that seen on the prior exam. The more proximal small bowel and stomach appear within normal limits. The appendix is not well visualized. Vascular/Lymphatic: No significant vascular findings are present. No enlarged abdominal or pelvic lymph nodes. Reproductive: Uterus and bilateral adnexa are unremarkable. Other: No abdominal wall hernia or abnormality. No abdominopelvic ascites. Musculoskeletal: No acute or significant osseous findings. IMPRESSION: Changes in the right lower quadrant involving the distal ileum and proximal colon consistent with the given clinical history of Crohn's disease. 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 The overall appearance is similar to that seen on the prior exam from August of 2020. No definitive perforation or abscess formation is noted at this time. No other focal abnormality is noted. Electronically Signed   By: MInez CatalinaM.D.   On: 06/14/2019 16:55    Procedures Procedures (including critical care time)  Medications Ordered in ED Medications  sodium  chloride flush (NS) 0.9 % injection 3 mL (3 mLs Intravenous Not Given 06/14/19 1454)  methylPREDNISolone sodium succinate (SOLU-MEDROL) 125 mg/2 mL injection 125 mg (has no administration in time range)  sodium chloride 0.9 % bolus 1,000 mL (1,000 mLs Intravenous New Bag/Given 06/14/19 1529)  ondansetron (ZOFRAN) injection 4 mg (4 mg Intravenous Given 06/14/19 1532)  iohexol (OMNIPAQUE) 300 MG/ML solution 100 mL (100 mLs Intravenous Contrast Given 06/14/19 1616)    ED Course  I have reviewed the triage vital signs and the nursing notes.  Pertinent labs & imaging results that were available during my care of the patient were reviewed by me and considered in my medical decision making (see chart for details).    MDM Rules/Calculators/A&P                      Rick Warnick is a 29 y.o. female here presenting with abdominal pain and nausea.  Patient does have a history of Crohn's and is currently getting infusions.  I  think likely gastroenteritis versus partial SBO versus Crohn's flare.  Will get CBC, CMP, lipase, CT abdomen pelvis. Will give IVF and zofran and reassess.    5:16 PM Labs unremarkable.  CT showed mild Crohn's flare with small fistulous.  No abscess formation.  There is no perforation.  The finding is similar to previous.  I think she likely has mild Crohn's flare.  We will give a course of steroids.  She has follow-up with her GI doctor next week.  Final Clinical Impression(s) / ED Diagnoses Final diagnoses:  None    Rx / DC Orders ED Discharge Orders    None       Drenda Freeze, MD 06/14/19 1718

## 2019-06-14 NOTE — Discharge Instructions (Signed)
Take Zofran for nausea.  Take prednisone as prescribed.  Talk to your GI doctor about getting infusion early.  Return to ER if you have vomiting, severe pain, fever, diarrhea, dehydration

## 2019-06-14 NOTE — ED Triage Notes (Signed)
Pt c/o abd pain, nausea started ~630am-states feel like "crohns"-NAD-steady gait

## 2019-10-13 ENCOUNTER — Emergency Department (HOSPITAL_BASED_OUTPATIENT_CLINIC_OR_DEPARTMENT_OTHER)
Admission: EM | Admit: 2019-10-13 | Discharge: 2019-10-13 | Disposition: A | Discharge status: Home / Self Care | Payer: Medicaid Other | Attending: Emergency Medicine | Admitting: Emergency Medicine

## 2019-10-13 ENCOUNTER — Encounter (HOSPITAL_BASED_OUTPATIENT_CLINIC_OR_DEPARTMENT_OTHER): Payer: Self-pay

## 2019-10-13 ENCOUNTER — Other Ambulatory Visit: Payer: Self-pay

## 2019-10-13 DIAGNOSIS — F17210 Nicotine dependence, cigarettes, uncomplicated: Secondary | ICD-10-CM | POA: Insufficient documentation

## 2019-10-13 DIAGNOSIS — J029 Acute pharyngitis, unspecified: Secondary | ICD-10-CM | POA: Insufficient documentation

## 2019-10-13 DIAGNOSIS — Z20822 Contact with and (suspected) exposure to covid-19: Secondary | ICD-10-CM | POA: Insufficient documentation

## 2019-10-13 DIAGNOSIS — Z79899 Other long term (current) drug therapy: Secondary | ICD-10-CM | POA: Insufficient documentation

## 2019-10-13 LAB — SARS CORONAVIRUS 2 BY RT PCR (HOSPITAL ORDER, PERFORMED IN ~~LOC~~ HOSPITAL LAB): SARS Coronavirus 2: NEGATIVE

## 2019-10-13 MED ORDER — AMOXICILLIN 500 MG PO CAPS
1000.0000 mg | ORAL_CAPSULE | Freq: Two times a day (BID) | ORAL | 0 refills | Status: AC
Start: 2019-10-13 — End: 2019-10-20

## 2019-10-13 MED FILL — AMOXICILLIN 500 MG CAPSULE: 500 | 7 days supply | Qty: 28 | Fill #0

## 2019-10-13 NOTE — ED Triage Notes (Signed)
Pt arrives with c/o slight sore throat on right side states she feels like something is stuck in her throat X2 days. Pt has also had a runny nose.

## 2019-10-13 NOTE — Discharge Instructions (Signed)
Take tylenol 2 pills 4 times a day and motrin 4 pills 3 times a day.  Drink plenty of fluids.  Return for worsening shortness of breath, headache, confusion. Follow up with your family doctor.   

## 2019-10-13 NOTE — ED Provider Notes (Signed)
[facility] EMERGENCY DEPARTMENT Provider Note   CSN: [id number] Arrival date & time: 10/13/19  2094     History Chief Complaint  Patient presents with  . Sore Throat    Jeanette Hines is a 29 y.o. female.  29 yo F with a chief complaints of a sore throat. This is worse on the right side. Been going on for about 3 days. Has had some mild congestion and cough as well. No known sick contacts. Feels like something is stuck in her throat. She denies any episodes where she was eating and felt like something got stuck. No vomiting. Has been able to tolerate by mouth without issue.  The history is provided by the patient.  Sore Throat This is a new problem. The current episode started 2 days ago. The problem occurs constantly. The problem has not changed since onset.Pertinent negatives include no chest pain, no headaches and no shortness of breath. Nothing aggravates the symptoms. Nothing relieves the symptoms. She has tried nothing for the symptoms. The treatment provided no relief.       Past Medical History:  Diagnosis Date  . Crohn disease ([place])     There are no problems to display for this patient.   History reviewed. No pertinent surgical history.   OB History   No obstetric history on file.     No family history on file.  Social History   Tobacco Use  . Smoking status: Current Every Day Smoker    Packs/day: 0.50    Types: Cigarettes  . Smokeless tobacco: Never Used  Vaping Use  . Vaping Use: Never used  Substance Use Topics  . Alcohol use: Yes    Comment: occ  . Drug use: No    Home Medications Prior to Admission medications   Medication Sig Start Date End Date Taking? Authorizing Provider  amoxicillin (AMOXIL) 500 MG capsule Take 2 capsules (1,000 mg total) by mouth 2 (two) times daily for 7 days. 10/13/19 10/20/19  Deno Etienne, DO  amoxicillin-clavulanate (AUGMENTIN) 875-125 MG tablet Take 1 tablet by mouth every 12 (twelve) hours. 04/28/15    Hedges, Dellis Filbert, PA-C  ibuprofen (ADVIL,MOTRIN) 800 MG tablet Take 1 tablet (800 mg total) by mouth 3 (three) times daily. 12/14/14   Charlesetta Shanks, MD  metroNIDAZOLE (FLAGYL) 500 MG tablet Take 1 tablet (500 mg total) by mouth 2 (two) times daily. 09/17/15   Mabe, Forbes Cellar, MD  metroNIDAZOLE (FLAGYL) 500 MG tablet Take 1 tablet (500 mg total) 2 (two) times daily by mouth. 12/16/16   Mackuen, Courteney Lyn, MD  naproxen (NAPROSYN) 500 MG tablet Take 1 tablet (500 mg total) by mouth 2 (two) times daily. 08/25/15   Nona Dell, PA-C  ondansetron (ZOFRAN ODT) 4 MG disintegrating tablet 36m ODT q4 hours prn nausea/vomit 06/14/19   YDrenda Freeze MD  predniSONE (DELTASONE) 20 MG tablet Take 60 mg daily x 2 days then 40 mg daily x 2 days then 20 mg daily x 2 days 06/14/19   YDrenda Freeze MD    Allergies    Bactrim [sulfamethoxazole-trimethoprim]  Review of Systems   Review of Systems  Constitutional: Negative for chills and fever.  HENT: Positive for congestion and sore throat. Negative for rhinorrhea, trouble swallowing and voice change.   Eyes: Negative for redness and visual disturbance.  Respiratory: Positive for cough. Negative for shortness of breath and wheezing.   Cardiovascular: Negative for chest pain and palpitations.  Gastrointestinal: Negative for nausea and vomiting.  Genitourinary: Negative for dysuria and urgency.  Musculoskeletal: Negative for arthralgias and myalgias.  Skin: Negative for pallor and wound.  Neurological: Negative for dizziness and headaches.    Physical Exam Updated Vital Signs BP 126/89 (BP Location: Left Arm)   Pulse 90   Temp 98.7 F (37.1 C) (Oral)   Resp 16   Ht 5' 2"  (1.575 m)   Wt 88 kg   LMP 09/29/2019   SpO2 100%   BMI 35.48 kg/m   Physical Exam Vitals and nursing note reviewed.  Constitutional:      General: She is not in acute distress.    Appearance: She is well-developed. She is not diaphoretic.  HENT:      Head: Normocephalic and atraumatic.     Comments: Swollen turbinates, posterior nasal drip, no noted sinus ttp, tm normal bilaterally.   No tonsillar swelling no exudates no anterior cervical lymphadenopathy. Eyes:     Pupils: Pupils are equal, round, and reactive to light.  Cardiovascular:     Rate and Rhythm: Normal rate and regular rhythm.     Heart sounds: No murmur heard.  No friction rub. No gallop.   Pulmonary:     Effort: Pulmonary effort is normal.     Breath sounds: No wheezing or rales.  Abdominal:     General: There is no distension.     Palpations: Abdomen is soft.     Tenderness: There is no abdominal tenderness.  Musculoskeletal:        General: No tenderness.     Cervical back: Normal range of motion and neck supple.  Skin:    General: Skin is warm and dry.  Neurological:     Mental Status: She is alert and oriented to person, place, and time.  Psychiatric:        Behavior: Behavior normal.     ED Results / Procedures / Treatments   Labs (all labs ordered are listed, but only abnormal results are displayed) Labs Reviewed  SARS CORONAVIRUS 2 BY RT PCR (HOSPITAL ORDER, [facility])    EKG None  Radiology No results found.  Procedures Procedures (including critical care time)  Medications Ordered in ED Medications - No data to display  ED Course  I have reviewed the triage vital signs and the nursing notes.  Pertinent labs & imaging results that were available during my care of the patient were reviewed by me and considered in my medical decision making (see chart for details).    MDM Rules/Calculators/A&P                          29 yo F with a chief complaints of a sore throat. Going on for about 3 days now. She is well-appearing and nontoxic. No bacterial source was found on exam. Patient does have a history of Crohn's disease and is on Remicade. She is concerned that her immune suppression may put her at a higher risk  for throat infection. Requesting antibiotics. We will have her follow-up with her family doctor.  Jeanette Hines was evaluated in Emergency Department on 10/13/2019 for the symptoms described in the history of present illness. He/she was evaluated in the context of the global COVID-19 pandemic, which necessitated consideration that the patient might be at risk for infection with the SARS-CoV-2 virus that causes COVID-19. Institutional protocols and algorithms that pertain to the evaluation of patients at risk for COVID-19 are in a state of  rapid change based on information released by regulatory bodies including the CDC and federal and state organizations. These policies and algorithms were followed during the patient's care in the ED.   10:52 AM:  I have discussed the diagnosis/risks/treatment options with the patient and believe the pt to be eligible for discharge home to follow-up with PCP. We also discussed returning to the ED immediately if new or worsening sx occur. We discussed the sx which are most concerning (e.g., sudden worsening pain, fever, inability to tolerate by mouth) that necessitate immediate return. Medications administered to the patient during their visit and any new prescriptions provided to the patient are listed below.  Medications given during this visit Medications - No data to display   The patient appears reasonably screen and/or stabilized for discharge and I doubt any other medical condition or other [facility] requiring further screening, evaluation, or treatment in the ED at this time prior to discharge.   Final Clinical Impression(s) / ED Diagnoses Final diagnoses:  Pharyngitis, unspecified etiology    Rx / DC Orders ED Discharge Orders         Ordered    amoxicillin (AMOXIL) 500 MG capsule  2 times daily        10/13/19 [place], Dreana Britz, DO 10/13/19 1052

## 2020-05-01 ENCOUNTER — Emergency Department (HOSPITAL_BASED_OUTPATIENT_CLINIC_OR_DEPARTMENT_OTHER)
Admission: EM | Admit: 2020-05-01 | Discharge: 2020-05-01 | Disposition: A | Discharge status: Home / Self Care | Payer: Medicaid Other | Attending: Emergency Medicine | Admitting: Emergency Medicine

## 2020-05-01 ENCOUNTER — Other Ambulatory Visit: Payer: Self-pay

## 2020-05-01 ENCOUNTER — Other Ambulatory Visit (HOSPITAL_BASED_OUTPATIENT_CLINIC_OR_DEPARTMENT_OTHER): Payer: Self-pay | Admitting: Emergency Medicine

## 2020-05-01 ENCOUNTER — Encounter (HOSPITAL_BASED_OUTPATIENT_CLINIC_OR_DEPARTMENT_OTHER): Payer: Self-pay

## 2020-05-01 DIAGNOSIS — R059 Cough, unspecified: Secondary | ICD-10-CM | POA: Insufficient documentation

## 2020-05-01 DIAGNOSIS — Z20822 Contact with and (suspected) exposure to covid-19: Secondary | ICD-10-CM | POA: Diagnosis not present

## 2020-05-01 DIAGNOSIS — J45909 Unspecified asthma, uncomplicated: Secondary | ICD-10-CM | POA: Insufficient documentation

## 2020-05-01 DIAGNOSIS — R0981 Nasal congestion: Secondary | ICD-10-CM | POA: Diagnosis present

## 2020-05-01 DIAGNOSIS — F17210 Nicotine dependence, cigarettes, uncomplicated: Secondary | ICD-10-CM | POA: Diagnosis not present

## 2020-05-01 HISTORY — DX: Unspecified asthma, uncomplicated: J45.909

## 2020-05-01 HISTORY — DX: Bronchitis, not specified as acute or chronic: J40

## 2020-05-01 LAB — SARS CORONAVIRUS 2 (TAT 6-24 HRS): SARS Coronavirus 2: NEGATIVE

## 2020-05-01 MED ORDER — DEXAMETHASONE 6 MG PO TABS
10.0000 mg | ORAL_TABLET | Freq: Once | ORAL | Status: AC
  Administered 2020-05-01: 10 mg via ORAL
  Filled 2020-05-01: qty 1

## 2020-05-01 MED ORDER — BACTROBAN NASAL 2 % NA OINT: 1.0000 "application " | TOPICAL_OINTMENT | Freq: Two times a day (BID) | NASAL | 0 refills | Status: DC

## 2020-05-01 MED ORDER — AFRIN NASAL SPRAY 0.05 % NA SOLN
1.0000 | Freq: Two times a day (BID) | NASAL | 0 refills | Status: DC
Start: 2020-05-01 — End: 2020-05-01

## 2020-05-01 MED FILL — MUPIROCIN 2% OINTMENT: 2 | 10 days supply | Qty: 22 | Fill #0

## 2020-05-01 MED FILL — 12 HOUR NASAL DECONGESTANT: 0.05 | 30 days supply | Qty: 30 | Fill #0

## 2020-05-01 NOTE — ED Provider Notes (Signed)
[facility] EMERGENCY DEPARTMENT Provider Note   CSN: [id number] Arrival date & time: 05/01/20  1114     History Chief Complaint  Patient presents with  . Cough    Jeanette Hines is a 30 y.o. female.  The history is provided by the patient.  URI Presenting symptoms: congestion and cough   Presenting symptoms: no ear pain, no fever and no sore throat   Severity:  Mild Onset quality:  Gradual Chronicity:  Recurrent Relieved by:  Nothing Worsened by:  Nothing Associated symptoms: sinus pain   Associated symptoms: no arthralgias, no headaches, no myalgias and no neck pain        Past Medical History:  Diagnosis Date  . Asthma   . Bronchitis   . Crohn disease ([place])     There are no problems to display for this patient.   History reviewed. No pertinent surgical history.   OB History   No obstetric history on file.     History reviewed. No pertinent family history.  Social History   Tobacco Use  . Smoking status: Current Every Day Smoker    Types: Cigarettes  . Smokeless tobacco: Never Used  . Tobacco comment: 1-2 cigarettes daily  Vaping Use  . Vaping Use: Never used  Substance Use Topics  . Alcohol use: Yes    Comment: occ  . Drug use: No    Home Medications Prior to Admission medications   Medication Sig Start Date End Date Taking? Authorizing Provider  mupirocin nasal ointment (BACTROBAN NASAL) 2 % Place 1 application into the nose 2 (two) times daily. Use one-half of tube in each nostril twice daily for five (5) days. After application, press sides of nose together and gently massage. 05/01/20  Yes Shamila Lerch, DO  oxymetazoline (AFRIN NASAL SPRAY) 0.05 % nasal spray Place 1 spray into both nostrils 2 (two) times daily. 05/01/20  Yes Javarious Elsayed, DO  amoxicillin-clavulanate (AUGMENTIN) 875-125 MG tablet Take 1 tablet by mouth every 12 (twelve) hours. 04/28/15   Hedges, Dellis Filbert, PA-C  ibuprofen (ADVIL,MOTRIN) 800 MG tablet Take 1  tablet (800 mg total) by mouth 3 (three) times daily. 12/14/14   Charlesetta Shanks, MD  metroNIDAZOLE (FLAGYL) 500 MG tablet Take 1 tablet (500 mg total) by mouth 2 (two) times daily. 09/17/15   Mabe, Forbes Cellar, MD  metroNIDAZOLE (FLAGYL) 500 MG tablet Take 1 tablet (500 mg total) 2 (two) times daily by mouth. 12/16/16   Mackuen, Courteney Lyn, MD  naproxen (NAPROSYN) 500 MG tablet Take 1 tablet (500 mg total) by mouth 2 (two) times daily. 08/25/15   Nona Dell, PA-C  ondansetron (ZOFRAN ODT) 4 MG disintegrating tablet 76m ODT q4 hours prn nausea/vomit 06/14/19   YDrenda Freeze MD  predniSONE (DELTASONE) 20 MG tablet Take 60 mg daily x 2 days then 40 mg daily x 2 days then 20 mg daily x 2 days 06/14/19   YDrenda Freeze MD    Allergies    Bactrim [sulfamethoxazole-trimethoprim]  Review of Systems   Review of Systems  Constitutional: Negative for fever.  HENT: Positive for congestion, sinus pressure and sinus pain. Negative for drooling, ear discharge, ear pain, sore throat and trouble swallowing.   Respiratory: Positive for cough. Negative for shortness of breath.   Musculoskeletal: Negative for arthralgias, myalgias and neck pain.  Neurological: Negative for headaches.    Physical Exam Updated Vital Signs BP 120/82 (BP Location: Right Arm)   Pulse 96   Temp 98.2 F (36.8  C) (Oral)   Resp 18   Ht 5' 2"  (1.575 m)   Wt 93 kg   LMP 04/12/2020   SpO2 100%   BMI 37.49 kg/m   Physical Exam Constitutional:      General: She is not in acute distress.    Appearance: She is not ill-appearing.  HENT:     Nose: Congestion present.     Mouth/Throat:     Mouth: Mucous membranes are moist.  Eyes:     Pupils: Pupils are equal, round, and reactive to light.  Cardiovascular:     Pulses: Normal pulses.  Pulmonary:     Effort: Pulmonary effort is normal.     Breath sounds: No wheezing.  Neurological:     Mental Status: She is alert.     ED Results / Procedures /  Treatments   Labs (all labs ordered are listed, but only abnormal results are displayed) Labs Reviewed  SARS CORONAVIRUS 2 (TAT 6-24 HRS)    EKG None  Radiology No results found.  Procedures Procedures   Medications Ordered in ED Medications  dexamethasone (DECADRON) tablet 10 mg (has no administration in time range)    ED Course  I have reviewed the triage vital signs and the nursing notes.  Pertinent labs & imaging results that were available during my care of the patient were reviewed by me and considered in my medical decision making (see chart for details).    MDM Rules/Calculators/A&P                          Guynell Kleiber is here with nasal congestion.  Will test for Covid.  No fever.  No signs of throat infection.  Clear breath sounds.  No concern for pneumonia.  Overall suspect allergies.  Given a dose of Decadron.  She states that she has no concern for pregnancy.  Will prescribe Bactroban for nasal irritation as well as Afrin.  Recommend antihistamines as well.  Discharged in good condition.  Understands return precautions.  This chart was dictated using voice recognition software.  Despite best efforts to proofread,  errors can occur which can change the documentation meaning.    Final Clinical Impression(s) / ED Diagnoses Final diagnoses:  Nasal congestion    Rx / DC Orders ED Discharge Orders         Ordered    mupirocin nasal ointment (BACTROBAN NASAL) 2 %  2 times daily        05/01/20 1129    oxymetazoline (AFRIN NASAL SPRAY) 0.05 % nasal spray  2 times daily        05/01/20 1129           Lennice Sites, DO 05/01/20 1132

## 2020-05-01 NOTE — Discharge Instructions (Addendum)
You have been tested for Covid.  Overall suspect you have allergies.  Take medications as prescribed.  I suspect you start feeling better once steroid takes full effect.

## 2020-05-01 NOTE — ED Triage Notes (Signed)
Pt states has been coughing x 2 days, states coughing because her throat itching. Afebrile. States doesn't feel sick. Has allergies, but nothing major. Denies SOB. States cough is worse at night, hasnt taken meds.

## 2020-07-19 ENCOUNTER — Other Ambulatory Visit: Payer: Self-pay | Admitting: Oncology

## 2020-07-19 ENCOUNTER — Other Ambulatory Visit: Payer: Self-pay | Admitting: Neurology

## 2020-07-19 DIAGNOSIS — R9389 Abnormal findings on diagnostic imaging of other specified body structures: Secondary | ICD-10-CM

## 2020-07-27 ENCOUNTER — Other Ambulatory Visit: Payer: Self-pay

## 2020-07-27 ENCOUNTER — Ambulatory Visit
Admission: RE | Admit: 2020-07-27 | Discharge: 2020-07-27 | Disposition: A | Discharge status: Home / Self Care | Payer: Medicaid Other | Source: Ambulatory Visit | Attending: Oncology | Admitting: Oncology

## 2020-07-27 ENCOUNTER — Other Ambulatory Visit (HOSPITAL_COMMUNITY): Payer: Self-pay | Admitting: Diagnostic Radiology

## 2020-07-27 DIAGNOSIS — R9389 Abnormal findings on diagnostic imaging of other specified body structures: Secondary | ICD-10-CM

## 2020-07-27 MED ORDER — GADOBUTROL 1 MMOL/ML IV SOLN
10.0000 mL | Freq: Once | INTRAVENOUS | Status: AC | PRN
  Administered 2020-07-27: 10 mL via INTRAVENOUS

## 2020-08-02 ENCOUNTER — Encounter (HOSPITAL_BASED_OUTPATIENT_CLINIC_OR_DEPARTMENT_OTHER): Payer: Self-pay | Admitting: *Deleted

## 2020-08-02 ENCOUNTER — Emergency Department (HOSPITAL_BASED_OUTPATIENT_CLINIC_OR_DEPARTMENT_OTHER)
Admission: EM | Admit: 2020-08-02 | Discharge: 2020-08-02 | Disposition: A | Discharge status: Home / Self Care | Payer: Medicaid Other | Attending: Emergency Medicine | Admitting: Emergency Medicine

## 2020-08-02 ENCOUNTER — Encounter: Payer: Self-pay | Admitting: Emergency Medicine

## 2020-08-02 ENCOUNTER — Other Ambulatory Visit: Payer: Self-pay

## 2020-08-02 DIAGNOSIS — Z853 Personal history of malignant neoplasm of breast: Secondary | ICD-10-CM | POA: Diagnosis not present

## 2020-08-02 DIAGNOSIS — J45909 Unspecified asthma, uncomplicated: Secondary | ICD-10-CM | POA: Diagnosis not present

## 2020-08-02 DIAGNOSIS — F17210 Nicotine dependence, cigarettes, uncomplicated: Secondary | ICD-10-CM | POA: Diagnosis not present

## 2020-08-02 DIAGNOSIS — R1030 Lower abdominal pain, unspecified: Secondary | ICD-10-CM | POA: Diagnosis present

## 2020-08-02 DIAGNOSIS — R310 Gross hematuria: Secondary | ICD-10-CM | POA: Diagnosis not present

## 2020-08-02 DIAGNOSIS — R350 Frequency of micturition: Secondary | ICD-10-CM | POA: Diagnosis not present

## 2020-08-02 DIAGNOSIS — R3915 Urgency of urination: Secondary | ICD-10-CM | POA: Diagnosis not present

## 2020-08-02 DIAGNOSIS — C50919 Malignant neoplasm of unspecified site of unspecified female breast: Secondary | ICD-10-CM | POA: Insufficient documentation

## 2020-08-02 HISTORY — DX: Malignant neoplasm of unspecified site of unspecified female breast: C50.919

## 2020-08-02 LAB — PREGNANCY, URINE: Preg Test, Ur: NEGATIVE

## 2020-08-02 LAB — URINALYSIS, ROUTINE W REFLEX MICROSCOPIC
Bilirubin Urine: NEGATIVE
Glucose, UA: NEGATIVE mg/dL
Ketones, ur: NEGATIVE mg/dL
Leukocytes,Ua: NEGATIVE
Nitrite: NEGATIVE
Protein, ur: NEGATIVE mg/dL
Specific Gravity, Urine: 1.025 (ref 1.005–1.030)
pH: 6 (ref 5.0–8.0)

## 2020-08-02 LAB — URINALYSIS, MICROSCOPIC (REFLEX): RBC / HPF: >50 RBC/hpf (ref 0–5)

## 2020-08-02 MED ORDER — NITROFURANTOIN MONOHYD MACRO 100 MG PO CAPS
100.0000 mg | ORAL_CAPSULE | Freq: Once | ORAL | Status: AC
  Administered 2020-08-02: 100 mg via ORAL
  Filled 2020-08-02: qty 1

## 2020-08-02 MED ORDER — NITROFURANTOIN MONOHYD MACRO 100 MG PO CAPS: 100.0000 mg | ORAL_CAPSULE | Freq: Two times a day (BID) | ORAL | 0 refills | Status: DC

## 2020-08-02 NOTE — ED Provider Notes (Signed)
[facility] EMERGENCY DEPARTMENT Provider Note   CSN: [id number] Arrival date & time: 08/02/20  1845     History Chief Complaint  Patient presents with   Dysuria    Jeanette Hines is a 30 y.o. female.  HPI     Jeanette Hines is a 30 y.o. female, with a history of asthma, breast cancer, Crohn's disease, presenting to the ED with hematuria, frequency, urgency, suprapubic discomfort. Patient states, "I know I have a UTI, this is how they come on for me. I just need an antibiotic so I can feel better."  She had her first chemotherapy infusion 2 days ago for breast cancer.  Denies fever/chills, N/V/D, other abdominal pain, chest pain, shortness of breath, flank/back pain, abnormal vaginal bleeding/discharge, or any other complaints.  Past Medical History:  Diagnosis Date   Asthma    Breast CA ([place])    Bronchitis    Crohn disease ([place])     Patient Active Problem List   Diagnosis Date Noted   Breast CA ([place]) 08/02/2020    Past Surgical History:  Procedure Laterality Date   PORTA CATH INSERTION       OB History   No obstetric history on file.     No family history on file.  Social History   Tobacco Use   Smoking status: Every Day    Pack years: 0.00    Types: Cigarettes   Smokeless tobacco: Never   Tobacco comments:    1-2 cigarettes daily  Vaping Use   Vaping Use: Never used  Substance Use Topics   Alcohol use: Yes    Comment: occ   Drug use: No    Home Medications Prior to Admission medications   Medication Sig Start Date End Date Taking? Authorizing Provider  CARBOPLATIN IV Inject 900 mg into the vein every 21 ( twenty-one) days. 07/31/20  Yes [provider]  DOCEtaxel (TAXOTERE IV) Inject 152 mg into the vein every 21 ( twenty-one) days. 07/31/20  Yes [provider]  fosaprepitant 150 mg in sodium chloride 0.9 % 145 mL Inject 150 mg into the vein every 21 ( twenty-one) days.   Yes [provider]   nitrofurantoin, macrocrystal-monohydrate, (MACROBID) 100 MG capsule Take 1 capsule (100 mg total) by mouth 2 (two) times daily. 08/02/20  Yes Jacoby Ritsema C, PA-C  Pertuzumab (PERJETA IV) Inject 840 mg into the vein every 21 ( twenty-one) days.   Yes [provider]  Trastuzumab-dkst (OGIVRI IV) Inject 756 mg into the vein every 21 ( twenty-one) days. 07/31/20  Yes [provider]  ibuprofen (ADVIL,MOTRIN) 800 MG tablet Take 1 tablet (800 mg total) by mouth 3 (three) times daily. 12/14/14   Charlesetta Shanks, MD  mupirocin nasal ointment (BACTROBAN NASAL) 2 % Place 1 application into the nose 2 (two) times daily. Use one-half of tube in each nostril twice daily for five (5) days. After application, press sides of nose together and gently massage. 05/01/20   Curatolo, Adam, DO  mupirocin ointment (BACTROBAN) 2 % PLACE 1 APPLICATION INTO THE NOSE 2 (TWO) TIMES DAILY. AFTER APPLICATION, PRESS SIDES OF NOSE TOGETHER AND GENTLY MASSAGE 05/01/20 05/01/21  Curatolo, Adam, DO  naproxen (NAPROSYN) 500 MG tablet Take 1 tablet (500 mg total) by mouth 2 (two) times daily. 08/25/15   Nona Dell, PA-C  ondansetron (ZOFRAN ODT) 4 MG disintegrating tablet 59m ODT q4 hours prn nausea/vomit 06/14/19   YDrenda Freeze MD  oxymetazoline (AFRIN) 0.05 % nasal spray PLACE  1 SPRAY INTO BOTH NOSTRILS 2 (TWO) TIMES DAILY. 05/01/20 05/01/21  Curatolo, Adam, DO  predniSONE (DELTASONE) 20 MG tablet Take 60 mg daily x 2 days then 40 mg daily x 2 days then 20 mg daily x 2 days 06/14/19   Drenda Freeze, MD    Allergies    Bactrim [sulfamethoxazole-trimethoprim]  Review of Systems   Review of Systems  Constitutional:  Negative for chills and fever.  Gastrointestinal:  Negative for nausea and vomiting.  Genitourinary:  Positive for frequency and hematuria. Negative for difficulty urinating, dysuria, flank pain, vaginal bleeding and vaginal discharge.  Musculoskeletal:  Negative for back pain.   Neurological:  Negative for weakness and numbness.   Physical Exam Updated Vital Signs BP 117/65 (BP Location: Left Arm)   Pulse 77   Temp 98.4 F (36.9 C) (Oral)   Resp 20   Ht 5' 2"  (1.575 m)   Wt 95.3 kg   LMP 07/04/2020   SpO2 100%   BMI 38.41 kg/m   Physical Exam Vitals and nursing note reviewed.  Constitutional:      General: She is not in acute distress.    Appearance: Normal appearance. She is well-developed. She is not diaphoretic.  HENT:     Head: Normocephalic and atraumatic.  Eyes:     Conjunctiva/sclera: Conjunctivae normal.  Cardiovascular:     Rate and Rhythm: Normal rate and regular rhythm.  Pulmonary:     Effort: Pulmonary effort is normal.  Abdominal:     General: There is no distension.     Palpations: Abdomen is soft.     Tenderness: There is no abdominal tenderness. There is no right CVA tenderness, left CVA tenderness or guarding.  Musculoskeletal:     Cervical back: Neck supple.  Skin:    General: Skin is warm and dry.     Coloration: Skin is not pale.  Neurological:     Mental Status: She is alert.  Psychiatric:        Behavior: Behavior normal.    ED Results / Procedures / Treatments   Labs (all labs ordered are listed, but only abnormal results are displayed) Labs Reviewed  URINALYSIS, ROUTINE W REFLEX MICROSCOPIC - Abnormal; Notable for the following components:      Result Value   Color, Urine RED (*)    APPearance CLOUDY (*)    Hgb urine dipstick LARGE (*)    All other components within normal limits  URINALYSIS, MICROSCOPIC (REFLEX) - Abnormal; Notable for the following components:   Bacteria, UA FEW (*)    All other components within normal limits  URINE CULTURE  PREGNANCY, URINE    EKG None  Radiology No results found.  Procedures Procedures   Medications Ordered in ED Medications - No data to display  ED Course  I have reviewed the triage vital signs and the nursing notes.  Pertinent labs & imaging results  that were available during my care of the patient were reviewed by me and considered in my medical decision making (see chart for details).    MDM Rules/Calculators/A&P                          Patient presents with chief complaint of hematuria. Patient is nontoxic appearing, afebrile, not tachycardic, not tachypneic, not hypotensive, excellent SPO2 on room air, and is in no apparent distress.  Urine culture pending.  She will follow-up with oncology/urology.  Findings and plan of care discussed with  attending physician, Lennice Sites, DO.   Final Clinical Impression(s) / ED Diagnoses Final diagnoses:  Gross hematuria    Rx / DC Orders ED Discharge Orders          Ordered    nitrofurantoin, macrocrystal-monohydrate, (MACROBID) 100 MG capsule  2 times daily        08/02/20 2012             Layla Maw 08/02/20 2018    Lennice Sites, DO 08/02/20 2322

## 2020-08-02 NOTE — Discharge Instructions (Addendum)
Recommend follow-up with oncology and urology as soon as possible on this matter.  Please take all of your antibiotics until finished!   You may develop abdominal discomfort or diarrhea from the antibiotic.  You may help offset this with probiotics which you can buy or get in yogurt. Do not eat or take the probiotics until 2 hours after your antibiotic.

## 2020-08-02 NOTE — ED Triage Notes (Signed)
C/o freq urination x 1 day , generalized " body pain " 1st chemo tx was tues

## 2020-08-04 LAB — URINE CULTURE

## 2020-11-05 ENCOUNTER — Ambulatory Visit (HOSPITAL_COMMUNITY): Payer: Medicaid Other | Admitting: Internal Medicine

## 2020-11-07 ENCOUNTER — Other Ambulatory Visit: Payer: Self-pay

## 2020-11-07 ENCOUNTER — Ambulatory Visit (HOSPITAL_COMMUNITY)
Admission: RE | Admit: 2020-11-07 | Discharge: 2020-11-07 | Disposition: A | Discharge status: Home / Self Care | Payer: Medicaid Other | Source: Ambulatory Visit | Attending: Internal Medicine | Admitting: Internal Medicine

## 2020-11-07 ENCOUNTER — Encounter (HOSPITAL_COMMUNITY): Payer: Self-pay | Admitting: Internal Medicine

## 2020-11-07 VITALS — BP 130/80 | HR 118 | Wt 175.4 lb

## 2020-11-07 DIAGNOSIS — R931 Abnormal findings on diagnostic imaging of heart and coronary circulation: Secondary | ICD-10-CM

## 2020-11-07 DIAGNOSIS — Z79899 Other long term (current) drug therapy: Secondary | ICD-10-CM | POA: Insufficient documentation

## 2020-11-07 DIAGNOSIS — R0602 Shortness of breath: Secondary | ICD-10-CM | POA: Insufficient documentation

## 2020-11-07 DIAGNOSIS — K5090 Crohn's disease, unspecified, without complications: Secondary | ICD-10-CM | POA: Diagnosis not present

## 2020-11-07 DIAGNOSIS — Z170 Estrogen receptor positive status [ER+]: Secondary | ICD-10-CM | POA: Diagnosis not present

## 2020-11-07 DIAGNOSIS — F17210 Nicotine dependence, cigarettes, uncomplicated: Secondary | ICD-10-CM | POA: Insufficient documentation

## 2020-11-07 DIAGNOSIS — C50011 Malignant neoplasm of nipple and areola, right female breast: Secondary | ICD-10-CM | POA: Insufficient documentation

## 2020-11-07 NOTE — Progress Notes (Signed)
CARDIO-ONCOLOGY CLINIC CONSULT NOTE  Referring Physician:Khan, Kalsoom Primary [facility] Primary Cardiologist:Vannie Hilgert, Quillian Quince  HPI:  Jeanette Hines is a 30 y.o. female with Invasive ductal carcinoma of R breast referred by Dr. Humphrey Rolls for enrollment into the Cardio-Oncology program.  Diagnosed by mammogram for right breast mass 06/21/20 followed by further imaging and biopsy.  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    Initiated on neoadjuvant chemotherapy and anti-HER2 therapy (TCHP) 07/31/2020 for invasive ductal carcinoma that is HER2 positive hormone receptor positive  Recently received cycle 5 of TCHP  11/04/20 admitted for crohn's flare with colitis by CT.  Colonoscopy by GI on 10/3. Areas visualized looked okay.  She was treated with ciprofloxacin, metronidazole and steroids. Her abdominal pain completely resolved. Discharged on 10/4. Continued on prednisone 60 mg daily for 2 more weeks with plans for slow taper by 10 mg every week.  Started on magnesium.  ECHO 07/19/20 EF 60-65%, LV GLS average -19.4% ECHO 10/19/20 EF 50-55%, LV GLS average -17.1%   Denies any current shortness of breath or chest pain.  Does report Shortness of breath for about a week after chemotherapy around a month ago and felt like passing out.  This was felt to be due to dehydration. Also reports significant anemia since chemo. She was given a short break from chemo and feels like she recovered well.  Denies any dyspnea after recent chemo treatment.  Eating and drinking well since release from the hospital and denies abdominal pain.     Review of Systems: [y] = yes, [ ]  = no   General: Weight gain [ ] ; Weight loss [ y]; Anorexia [ ] ; Fatigue [ ] ; Fever [ ] ; Chills [ ] ; Weakness [ ]   Cardiac: Chest pain/pressure [ ] ; Resting SOB [ ] ; Exertional SOB [ ] ; Orthopnea [ ] ; Pedal Edema [ ] ; Palpitations [ ] ; Syncope [ ] ; Presyncope [ ] ; Paroxysmal  nocturnal dyspnea[ ]   Pulmonary: Cough [ ] ; Wheezing[ ] ; Hemoptysis[ ] ; Sputum [ ] ; Snoring [ ]   GI: Vomiting[ ] ; Dysphagia[ ] ; Melena[ ] ; Hematochezia [ ] ; Heartburn[ ] ; Abdominal pain [ ] ; Constipation [ ] ; Diarrhea [ ] ; BRBPR [ ]   GU: Hematuria[ ] ; Dysuria [ ] ; Nocturia[ ]   Vascular: Pain in legs with walking [ ] ; Pain in feet with lying flat [ ] ; Non-healing sores [ ] ; Stroke [ ] ; TIA [ ] ; Slurred speech [ ] ;  Neuro: Headaches[ ] ; Vertigo[ ] ; Seizures[ ] ; Paresthesias[ ] ;Blurred vision [ ] ; Diplopia [ ] ; Vision changes [ ]   Ortho/Skin: Arthritis [ y]; Joint pain [ ] ; Muscle pain [ ] ; Joint swelling [ ] ; Back Pain [ ] ; Rash [ ]   Psych: Depression[ ] ; Anxiety[ ]   Heme: Bleeding problems [ ] ; Clotting disorders [ ] ; Anemia [ y]  Endocrine: Diabetes [ ] ; Thyroid dysfunction[ ]    Past Medical History:  Diagnosis Date   Asthma    Breast CA ([facility])    Bronchitis    Crohn disease ([facility])     Current Outpatient Medications  Medication Sig Dispense Refill   CARBOPLATIN IV Inject 900 mg into the vein every 21 ( twenty-one) days.     DOCEtaxel (TAXOTERE IV) Inject 152 mg into the vein every 21 ( twenty-one) days.     fosaprepitant 150 mg in sodium chloride 0.9 % 145 mL Inject 150 mg into the vein every 21 ( twenty-one) days.     ibuprofen (ADVIL,MOTRIN) 800 MG tablet Take 1 tablet (800  mg total) by mouth 3 (three) times daily. 21 tablet 0   naproxen (NAPROSYN) 500 MG tablet Take 1 tablet (500 mg total) by mouth 2 (two) times daily. 30 tablet 0   ondansetron (ZOFRAN ODT) 4 MG disintegrating tablet 33m ODT q4 hours prn nausea/vomit 10 tablet 0   oxymetazoline (AFRIN) 0.05 % nasal spray PLACE 1 SPRAY INTO BOTH NOSTRILS 2 (TWO) TIMES DAILY. 30 mL 0   Pertuzumab (PERJETA IV) Inject 840 mg into the vein every 21 ( twenty-one) days.     predniSONE (DELTASONE) 20 MG tablet Take 60 mg daily x 2 days then 40 mg daily x 2 days then 20 mg daily x 2 days 12 tablet 0   Trastuzumab-dkst (OGIVRI IV) Inject 756 mg  into the vein every 21 ( twenty-one) days.     No current facility-administered medications for this encounter.    Allergies  Allergen Reactions   Bactrim [Sulfamethoxazole-Trimethoprim] Other (See Comments)    Not effective      Social History   Socioeconomic History   Marital status: Single    Spouse name: Not on file   Number of children: Not on file   Years of education: Not on file   Highest education level: Not on file  Occupational History   Not on file  Tobacco Use   Smoking status: Every Day    Types: Cigarettes   Smokeless tobacco: Never   Tobacco comments:    1-2 cigarettes daily  Vaping Use   Vaping Use: Never used  Substance and Sexual Activity   Alcohol use: Yes    Comment: occ   Drug use: No   Sexual activity: Not on file  Other Topics Concern   Not on file  Social History Narrative   Not on file   Social Determinants of Health   Financial Resource Strain: Not on file  Food Insecurity: Not on file  Transportation Needs: Not on file  Physical Activity: Not on file  Stress: Not on file  Social Connections: Not on file  Intimate Partner Violence: Not on file     No family history on file.  Vitals:   11/07/20 1125 11/07/20 1130  BP: 130/80   Pulse: (!) 118   SpO2: 99%   Weight:  79.6 kg (175 lb 6.4 oz)    PHYSICAL EXAM: General:  Well appearing. No respiratory difficulty HEENT: normal Neck: supple. no JVD. Carotids 2+ bilat; no bruits. No lymphadenopathy or thryomegaly appreciated. Cor: PMI nondisplaced. Regular rate & rhythm. No rubs, gallops or murmurs. Lungs: clear Abdomen: soft, nontender, nondistended. No hepatosplenomegaly. No bruits or masses. Good bowel sounds. Extremities: no cyanosis, clubbing, rash, edema Neuro: alert & oriented x 3, cranial nerves grossly intact. moves all 4 extremities w/o difficulty. Affect pleasant.  ECG: NSR rate 99  ASSESSMENT & PLAN: 1. High-grade invasive ductal carcinoma, ER positive PR positive  HER2 results positive by FISH R Breast Cancer - s/p 5/6 rounds THCP -ECHO 07/19/20 EF 60-65%, LV GLS average -19.4% -ECHO 10/19/20 EF 50-55%, (at [facility]) LV GLS average -17.1%, read as less robust -Bedside ECHO today 11/07/2020 EF 60-65%  -Initiated on neoadjuvant chemotherapy and anti-HER2 therapy (TCHP) 07/31/2020 -She can continue chemotherapy and anti-HER2 therapy.  No need to stop therapy -She will follow up here in 3 months with repeat ECHO for continued monitoring  2. Crohn's disease - s/p recent admission for ab pain. Off remicade with chemotherapy   WKatherine Roan MD  11:43 AM  Patient seen and  examined with the above-signed Advanced Practice Provider and/or Housestaff. I personally reviewed laboratory data, imaging studies and relevant notes. I independently examined the patient and formulated the important aspects of the plan. I have edited the note to reflect any of my changes or salient points. I have personally discussed the plan with the patient and/or family.  30 y/o woman as above with recent diagnosis of breast CA referred toCardio-oncology clinic due to possibility of chemotherapy induced cardiomyopathy on echo EF 60-65% -> 50-55%   Denies h/o known heart disease. No cardiac symptoms  General:  Well appearing. No resp difficulty HEENT: normal Neck: supple. no JVD. Carotids 2+ bilat; no bruits. No lymphadenopathy or thryomegaly appreciated. Cor: PMI nondisplaced. Regular rate & rhythm. No rubs, gallops or murmurs. Lungs: clear Abdomen: obese soft, nontender, nondistended. No hepatosplenomegaly. No bruits or masses. Good bowel sounds. Extremities: no cyanosis, clubbing, rash, edema Neuro: alert & orientedx3, cranial nerves grossly intact. moves all 4 extremities w/o difficulty. Affect pleasant  Explained incidence of Herceptin cardiotoxicity and role of Cardio-oncology clinic at length.Bedside echo done in clinic today and EF 60-65% with no evidence of chemo-induced CM.  Continue current regimen. . Follow-up with echo in 2-3 months.  Glori Bickers, MD  9:02 PM

## 2020-11-07 NOTE — Patient Instructions (Addendum)
Good to see you! Appt January 6th at 9:00 am with Echocardiogram

## 2021-02-08 ENCOUNTER — Other Ambulatory Visit: Payer: Self-pay

## 2021-02-08 ENCOUNTER — Encounter (HOSPITAL_COMMUNITY): Payer: Self-pay | Admitting: Internal Medicine

## 2021-02-08 ENCOUNTER — Ambulatory Visit (HOSPITAL_BASED_OUTPATIENT_CLINIC_OR_DEPARTMENT_OTHER)
Admission: RE | Admit: 2021-02-08 | Discharge: 2021-02-08 | Disposition: A | Discharge status: Home / Self Care | Payer: Medicaid Other | Source: Ambulatory Visit | Attending: Internal Medicine | Admitting: Internal Medicine

## 2021-02-08 ENCOUNTER — Ambulatory Visit (HOSPITAL_COMMUNITY)
Admission: RE | Admit: 2021-02-08 | Discharge: 2021-02-08 | Disposition: A | Discharge status: Home / Self Care | Payer: Medicaid Other | Source: Ambulatory Visit | Attending: Internal Medicine | Admitting: Internal Medicine

## 2021-02-08 VITALS — BP 104/70 | HR 115 | Wt 157.2 lb

## 2021-02-08 DIAGNOSIS — K5090 Crohn's disease, unspecified, without complications: Secondary | ICD-10-CM | POA: Diagnosis not present

## 2021-02-08 DIAGNOSIS — Z79899 Other long term (current) drug therapy: Secondary | ICD-10-CM | POA: Diagnosis not present

## 2021-02-08 DIAGNOSIS — C50011 Malignant neoplasm of nipple and areola, right female breast: Secondary | ICD-10-CM | POA: Diagnosis not present

## 2021-02-08 DIAGNOSIS — Z7901 Long term (current) use of anticoagulants: Secondary | ICD-10-CM | POA: Diagnosis not present

## 2021-02-08 DIAGNOSIS — Z170 Estrogen receptor positive status [ER+]: Secondary | ICD-10-CM | POA: Insufficient documentation

## 2021-02-08 DIAGNOSIS — Z0189 Encounter for other specified special examinations: Secondary | ICD-10-CM | POA: Diagnosis not present

## 2021-02-08 DIAGNOSIS — F17210 Nicotine dependence, cigarettes, uncomplicated: Secondary | ICD-10-CM | POA: Insufficient documentation

## 2021-02-08 DIAGNOSIS — R000 Tachycardia, unspecified: Secondary | ICD-10-CM

## 2021-02-08 LAB — ECHOCARDIOGRAM COMPLETE
AR max vel: 2.39 cm2
AV Peak grad: 3.5 mmHg
Ao pk vel: 0.94 m/s
Area-P 1/2: 6.83 cm2
Calc EF: 48.4 %
S' Lateral: 3.1 cm
Single Plane A2C EF: 46.2 %
Single Plane A4C EF: 48.7 %

## 2021-02-08 MED ORDER — METOPROLOL SUCCINATE ER 25 MG PO TB24
25.0000 mg | ORAL_TABLET | Freq: Every day | ORAL | 6 refills | Status: AC
Start: 2021-02-08 — End: ?

## 2021-02-08 MED ORDER — LOSARTAN POTASSIUM 25 MG PO TABS: 12.5000 mg | ORAL_TABLET | Freq: Every day | ORAL | 6 refills | Status: DC

## 2021-02-08 NOTE — Patient Instructions (Signed)
Take Toprol 25 mg Daily AT BEDTIME  Take Losartan 12.5 mg (1/2 tab) Daily AT BEDTIME  Your physician recommends that you schedule a follow-up appointment in: 3-4 months with echocardiogram  If you have any questions or concerns before your next appointment please send [place] a message through [place] or call our office at [phone].    TO LEAVE A MESSAGE FOR THE NURSE SELECT OPTION 2, PLEASE LEAVE A MESSAGE INCLUDING: YOUR NAME DATE OF BIRTH CALL BACK NUMBER REASON FOR CALL**this is important as we prioritize the call backs  YOU WILL RECEIVE A CALL BACK THE SAME DAY AS LONG AS YOU CALL BEFORE 4:00 PM

## 2021-02-08 NOTE — Progress Notes (Signed)
CARDIO-ONCOLOGY CLINIC CONSULT NOTE  Referring Physician:Khan, Kalsoom Primary [facility] Primary Cardiologist:Zyliah Schier, Quillian Quince  HPI:  Jeanette Hines is a 31 y.o. female with Invasive ductal carcinoma of R breast referred by Dr. Humphrey Rolls for enrollment into the Cardio-Oncology program.  Diagnosed by mammogram for right breast mass 06/21/20 followed by further imaging and biopsy.  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    Initiated on neoadjuvant chemotherapy and anti-HER2 therapy (TCHP) 07/31/2020 for invasive ductal carcinoma that is HER2 positive hormone receptor positive  Recently received cycle 5 of TCHP  11/04/20 admitted for crohn's flare with colitis by CT.  Colonoscopy by GI on 10/3. Areas visualized looked okay.  She was treated with ciprofloxacin, metronidazole and steroids. Her abdominal pain completely resolved. Discharged on 10/4. Continued on prednisone 60 mg daily for 2 more weeks with plans for slow taper by 10 mg every week.  Started on magnesium.  Overall feeling fine. Denies SOB/PND/Orthopnea. Appetite ok. No fever or chills. Weight at home stable.  Taking all medications.   ECHO 07/19/20 EF 60-65%, LV GLS average -19.4% ECHO 10/19/20 EF 50-55%, LV GLS average -17.1% ECHO 02/08/21 EF 55-60% GLS -13.4%. but underestimated on ECHO.      Past Medical History:  Diagnosis Date   Asthma    Breast CA ([place])    Bronchitis    Crohn disease ([facility])     Current Outpatient Medications  Medication Sig Dispense Refill   ondansetron (ZOFRAN ODT) 4 MG disintegrating tablet 89m ODT q4 hours prn nausea/vomit 10 tablet 0   No current facility-administered medications for this encounter.    Allergies  Allergen Reactions   Bactrim [Sulfamethoxazole-Trimethoprim] Other (See Comments)    Not effective   Nickel Dermatitis      Social History   Socioeconomic History   Marital status: Single    Spouse name: Not on  file   Number of children: Not on file   Years of education: Not on file   Highest education level: Not on file  Occupational History   Not on file  Tobacco Use   Smoking status: Every Day    Types: Cigarettes   Smokeless tobacco: Never   Tobacco comments:    1-2 cigarettes daily  Vaping Use   Vaping Use: Never used  Substance and Sexual Activity   Alcohol use: Yes    Comment: occ   Drug use: No   Sexual activity: Not on file  Other Topics Concern   Not on file  Social History Narrative   Not on file   Social Determinants of Health   Financial Resource Strain: Not on file  Food Insecurity: Not on file  Transportation Needs: Not on file  Physical Activity: Not on file  Stress: Not on file  Social Connections: Not on file  Intimate Partner Violence: Not on file     History reviewed. No pertinent family history.  Vitals:   02/08/21 0953  BP: 104/70  Pulse: (!) 115  SpO2: 99%  Weight: 71.3 kg     PHYSICAL EXAM: General:  Well appearing. No respiratory difficulty HEENT: normal Neck: supple. no JVD. Carotids 2+ bilat; no bruits. No lymphadenopathy or thryomegaly appreciated. Cor: PMI nondisplaced. Regular rate & rhythm. No rubs, gallops or murmurs. Lungs: clear Abdomen: soft, nontender, nondistended. No hepatosplenomegaly. No bruits or masses. Good bowel sounds. Extremities: no cyanosis, clubbing, rash, edema Neuro: alert & oriented x 3, cranial nerves grossly intact. moves all 4 extremities w/o  difficulty. Affect pleasant.  ECG: Declined EKG  ASSESSMENT & PLAN: 1. 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   - Add  Toprol XL 25 mg at bedtime  - Add Losaratn 12.5 mg at bedtime.  - Repeat ECHO 2-3 months.   2. Crohn's disease - s/p recent admission for ab pain. Off remicade with chemotherapy   3. Tachycardia -Declined EKG -Adding carvedilol as noted above.  - Check CBC, TSH   Will ask Dr Humphrey Rolls to check CBC and TSH. She declined lab work .  Follow up in 2-3 months with an ECHO.   Darrick Grinder, NP  10:34 AM  Patient seen and examined with the above-signed Advanced Practice Provider and/or Housestaff. I personally reviewed laboratory data, imaging studies and relevant notes. I independently examined the patient and formulated the important aspects of the plan. I have edited the note to reflect any of my changes or salient points. I have personally discussed the plan with the patient and/or family.  Doing well. Somewhat tachycardic. Tolerating Keytruda.   Echo today EF 55-60% but GLS down (appears to be suboptimal endocardial tracking)  General:  Well appearing. No resp difficulty HEENT: normal Neck: supple. no JVD. Carotids 2+ bilat; no bruits. No lymphadenopathy or thryomegaly appreciated. Cor: PMI nondisplaced. Regular rate & rhythm. No rubs, gallops or murmurs. Lungs: clear Abdomen: obese soft, nontender, nondistended. No hepatosplenomegaly. No bruits or masses. Good bowel sounds. Extremities: no cyanosis, clubbing, rash, edema Neuro: alert & orientedx3, cranial nerves grossly intact. moves all 4 extremities w/o difficulty. Affect pleasant  Echo stable. Continue Keytruda. Start carvedilol and losartan for cardioprotection. Repeat echo in 2-3 months. Continue Keytruda.   Glori Bickers, MD  9:55 AM

## 2021-05-02 NOTE — Progress Notes (Signed)
? ?CARDIO-ONCOLOGY CLINIC  NOTE ? ?Referring Physician:Khan, Kalsoom ?Primary [facility] ?Primary Cardiologist:Mitcheal Sweetin, Quillian Quince ? ?HPI:  Jeanette Hines is a 31 y.o. female with Invasive ductal carcinoma of R breast referred by Dr. Humphrey Rolls for enrollment into the Cardio-Oncology program. ? ?Diagnosed by mammogram for right breast mass 06/21/20 followed by further imaging and biopsy.  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   ? ?Initiated on neoadjuvant chemotherapy and anti-HER2 therapy (TCHP) 07/31/2020 for invasive ductal carcinoma that is HER2 positive hormone receptor positive ? ?Recently received cycle 5 of TCHP ? ?11/04/20 admitted for crohn's flare with colitis by CT.  Colonoscopy by GI on 10/3. Areas visualized looked okay.  She was treated with ciprofloxacin, metronidazole and steroids. Her abdominal pain completely resolved. Discharged on 10/4. Continued on prednisone 60 mg daily for 2 more weeks with plans for slow taper by 10 mg every week.  Started on magnesium. ? ?Here for routine f/u. Says she is sleepy from drinking tequilia last night. Stopped Kadcyla due to neuropathy. Now back on Perjeta alone. Tolerating ok. BP is low from heart meds.  ? ?Echo today 05/03/21 EF 55-60% GLS underestimated  ? ?ECHO 07/19/20 EF 60-65%, LV GLS average -19.4% ?ECHO 10/19/20 EF 50-55%, LV GLS average -17.1% ?ECHO 02/08/21 EF 55-60% GLS -13.4%. but underestimated on ECHO.  ?  ? ? ?Past Medical History:  ?Diagnosis Date  ? Asthma   ? Breast CA ([place])   ? Bronchitis   ? Crohn disease ([place])   ? ? ?Current Outpatient Medications  ?Medication Sig Dispense Refill  ? losartan (COZAAR) 25 MG tablet Take 0.5 tablets (12.5 mg total) by mouth at bedtime. 15 tablet 6  ? metoprolol succinate (TOPROL XL) 25 MG 24 hr tablet Take 1 tablet (25 mg total) by mouth at bedtime. 30 tablet 6  ? ?No current facility-administered medications for this encounter.  ? ? ?Allergies  ?Allergen  Reactions  ? Bactrim [Sulfamethoxazole-Trimethoprim] Other (See Comments)  ?  Not effective  ? Nickel Dermatitis  ? ? ?  ?Social History  ? ?Socioeconomic History  ? Marital status: Single  ?  Spouse name: Not on file  ? Number of children: Not on file  ? Years of education: Not on file  ? Highest education level: Not on file  ?Occupational History  ? Not on file  ?Tobacco Use  ? Smoking status: Every Day  ?  Types: Cigarettes  ? Smokeless tobacco: Never  ? Tobacco comments:  ?  1-2 cigarettes daily  ?Vaping Use  ? Vaping Use: Never used  ?Substance and Sexual Activity  ? Alcohol use: Yes  ?  Comment: occ  ? Drug use: No  ? Sexual activity: Not on file  ?Other Topics Concern  ? Not on file  ?Social History Narrative  ? Not on file  ? ?Social Determinants of Health  ? ?Financial Resource Strain: Not on file  ?Food Insecurity: Not on file  ?Transportation Needs: Not on file  ?Physical Activity: Not on file  ?Stress: Not on file  ?Social Connections: Not on file  ?Intimate Partner Violence: Not on file  ? ? ? History reviewed. No pertinent family history. ? ?Vitals:  ? 05/03/21 0954  ?BP: 110/78  ?Pulse: 74  ?SpO2: 95%  ?Weight: 73 kg (161 lb)  ? ? ? ? ?PHYSICAL EXAM: ?General:  Fatigued appearing. No resp difficulty ?HEENT: normal ?Neck: supple. no JVD. Carotids 2+ bilat; no bruits. No lymphadenopathy or thryomegaly  appreciated. ?Cor: PMI nondisplaced. Regular rate & rhythm. No rubs, gallops or murmurs. ?Lungs: clear ?Abdomen: soft, nontender, nondistended. No hepatosplenomegaly. No bruits or masses. Good bowel sounds. ?Extremities: no cyanosis, clubbing, rash, edema ?Neuro: alert & orientedx3, cranial nerves grossly intact. moves all 4 extremities w/o difficulty. Affect pleasant ? ?ASSESSMENT & PLAN: ? ?1. 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 but underestimated on ECHO.  ?- Echo today 05/03/21 EF 55-60% GLS underestimated  ?- Continue Toprol XL 25 mg at bedtime  ?- Stop losartan due to low BP ?- Echo looks good. Continue Perjeta. ?- Repeat echo 3 months  ? ?3. Crohn's disease ?- Off remicade with chemotherapy  ? ?4. Tachycardia ?- Improved ? ?Glori Bickers, MD  ?10:28 AM ? ?  ?

## 2021-05-03 ENCOUNTER — Ambulatory Visit (HOSPITAL_COMMUNITY)
Admission: RE | Admit: 2021-05-03 | Discharge: 2021-05-03 | Disposition: A | Discharge status: Home / Self Care | Payer: Medicaid Other | Source: Ambulatory Visit | Attending: Internal Medicine | Admitting: Internal Medicine

## 2021-05-03 ENCOUNTER — Ambulatory Visit (HOSPITAL_BASED_OUTPATIENT_CLINIC_OR_DEPARTMENT_OTHER)
Admission: RE | Admit: 2021-05-03 | Discharge: 2021-05-03 | Disposition: A | Discharge status: Home / Self Care | Payer: Medicaid Other | Source: Ambulatory Visit

## 2021-05-03 ENCOUNTER — Encounter (HOSPITAL_COMMUNITY): Payer: Self-pay | Admitting: Internal Medicine

## 2021-05-03 VITALS — BP 110/78 | HR 74 | Wt 161.0 lb

## 2021-05-03 DIAGNOSIS — I427 Cardiomyopathy due to drug and external agent: Secondary | ICD-10-CM | POA: Diagnosis not present

## 2021-05-03 DIAGNOSIS — Z0189 Encounter for other specified special examinations: Secondary | ICD-10-CM | POA: Diagnosis not present

## 2021-05-03 DIAGNOSIS — C50011 Malignant neoplasm of nipple and areola, right female breast: Secondary | ICD-10-CM

## 2021-05-03 DIAGNOSIS — T451X5A Adverse effect of antineoplastic and immunosuppressive drugs, initial encounter: Secondary | ICD-10-CM | POA: Diagnosis not present

## 2021-05-03 LAB — ECHOCARDIOGRAM COMPLETE: S' Lateral: 3.3 cm

## 2021-05-03 NOTE — Addendum Note (Signed)
Encounter addended by: Stanford Scotland, RN on: 05/03/2021 10:37 AM ? Actions taken: Order list changed, Diagnosis association updated, Clinical Note Signed

## 2021-05-03 NOTE — Progress Notes (Signed)
?  Echocardiogram ?2D Echocardiogram has been performed. ? ?Jeanette Hines ?05/03/2021, 9:50 AM ?

## 2021-05-03 NOTE — Patient Instructions (Signed)
Good to see you today   ? ?No medication changes ? ?Your physician has requested that you have an echocardiogram. Echocardiography is a painless test that uses sound waves to create images of your heart. It provides your doctor with information about the size and shape of your heart and how well your heart?s chambers and valves are working. This procedure takes approximately one hour. There are no restrictions for this procedure. ? ?Your physician recommends that you schedule a follow-up appointment in: 3 months with echocardiogram( July appointment) ?Call in may to schedule the appointment ? ?If you have any questions or concerns before your next appointment please send [place] a message through [place] or call our office at [phone].   ? ?TO LEAVE A MESSAGE FOR THE NURSE SELECT OPTION 2, PLEASE LEAVE A MESSAGE INCLUDING: ?YOUR NAME ?DATE OF BIRTH ?CALL BACK NUMBER ?REASON FOR CALL**this is important as we prioritize the call backs ? ?YOU WILL RECEIVE A CALL BACK THE SAME DAY AS LONG AS YOU CALL BEFORE 4:00 PM ? ?At the [facility], you and your health needs are our priority. As part of our continuing mission to provide you with exceptional heart care, we have created designated Provider Care Teams. These Care Teams include your primary Cardiologist (physician) and Advanced Practice Providers (APPs- Physician Assistants and Nurse Practitioners) who all work together to provide you with the care you need, when you need it.  ? ?You may see any of the following providers on your designated Care Team at your next follow up: ?Dr Glori Bickers ?Dr Loralie Champagne ?Darrick Grinder, NP ?Lyda Jester, PA ?Jessica Milford,NP ?Marlyce Huge, PA ?Audry Riles, PharmD ? ? ?Please be sure to bring in all your medications bottles to every appointment.  ? ? ? ? ?

## 2021-08-01 ENCOUNTER — Encounter (HOSPITAL_COMMUNITY): Payer: Medicaid Other | Admitting: Internal Medicine

## 2021-08-01 ENCOUNTER — Other Ambulatory Visit (HOSPITAL_COMMUNITY): Payer: Medicaid Other

## 2021-08-09 ENCOUNTER — Ambulatory Visit (HOSPITAL_COMMUNITY)
Admission: RE | Admit: 2021-08-09 | Discharge: 2021-08-09 | Disposition: A | Discharge status: Home / Self Care | Payer: Medicaid Other | Source: Ambulatory Visit | Attending: Internal Medicine | Admitting: Internal Medicine

## 2021-08-09 DIAGNOSIS — C50919 Malignant neoplasm of unspecified site of unspecified female breast: Secondary | ICD-10-CM | POA: Insufficient documentation

## 2021-08-09 DIAGNOSIS — I427 Cardiomyopathy due to drug and external agent: Secondary | ICD-10-CM

## 2021-08-09 DIAGNOSIS — Z0189 Encounter for other specified special examinations: Secondary | ICD-10-CM | POA: Diagnosis not present

## 2021-08-09 DIAGNOSIS — T451X5A Adverse effect of antineoplastic and immunosuppressive drugs, initial encounter: Secondary | ICD-10-CM | POA: Diagnosis not present

## 2021-08-09 LAB — ECHOCARDIOGRAM COMPLETE
AR max vel: 2.5 cm2
AV Peak grad: 7 mmHg
Ao pk vel: 1.32 m/s
Area-P 1/2: 3.23 cm2
Calc EF: 54.2 %
S' Lateral: 3.1 cm
Single Plane A2C EF: 56.2 %
Single Plane A4C EF: 53.8 %

## 2021-08-16 ENCOUNTER — Ambulatory Visit (HOSPITAL_COMMUNITY)
Admission: RE | Admit: 2021-08-16 | Discharge: 2021-08-16 | Disposition: A | Discharge status: Home / Self Care | Payer: Medicaid Other | Source: Ambulatory Visit | Attending: Internal Medicine | Admitting: Internal Medicine

## 2021-08-16 DIAGNOSIS — C50011 Malignant neoplasm of nipple and areola, right female breast: Secondary | ICD-10-CM | POA: Diagnosis not present

## 2021-08-16 NOTE — Progress Notes (Signed)
CARDIO-ONCOLOGY CLINIC TELEPHONE VISIT NOTE  Referring Physician:Khan, Kalsoom Primary [facility] Primary Cardiologist:Kebin Maye, Quillian Quince   Patient unable to come to Clinic to discuss recent echo results. Audio/video telehealth visit is felt to be most appropriate for this patient at this time.  Patient unwilling to use video so audio only visit performed. See MyChart message from today for patient consent regarding telehealth for [facility]. The patient was identified personally using two identifiers.   Date:  08/16/2021   ID:  Jeanette Hines, DOB 01-10-1991, MRN [id number]  Location: Home  Provider location: [facility] Type of Visit: Established patient  PCP:  [facility] [place]  Cardiologist:  None Primary HF: Teal Raben  Chief Complaint: Heart Failure follow-up   History of Present Illness:   Jeanette Hines is a 31 y.o. female with Invasive ductal carcinoma of R breast referred by Dr. Humphrey Rolls for enrollment into the Cardio-Oncology program.  Diagnosed by mammogram for right breast mass 06/21/20 followed by further imaging and biopsy.  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    Initiated on neoadjuvant chemotherapy and anti-HER2 therapy (TCHP) 07/31/2020 for invasive ductal carcinoma that is HER2 positive hormone receptor positive  Recently received cycle 5 of TCHP  11/04/20 admitted for crohn's flare with colitis by CT.  Colonoscopy by GI on 10/3. Areas visualized looked okay.  She was treated with ciprofloxacin, metronidazole and steroids. Her abdominal pain completely resolved. Discharged on 10/4. Continued on prednisone 60 mg daily for 2 more weeks with plans for slow taper by 10 mg every week.  Started on magnesium.  Interval history: Stopped Kadcyla due to neuropathy. Previously was on Perjeta alone. But now on Ogiviri. Tolerating well. Says she feels awesome.  Losartan stopped due to low BP. Says she wasn't taking Toprol prior to her echo but now taking it daily. No CP or SOB. Very hyper.   Echo 08/09/21 EF 55-60% GLS -24%  Echo 05/03/21 EF 55-60% GLS underestimated  ECHO 07/19/20 EF 60-65%, LV GLS average -19.4% ECHO 10/19/20 EF 50-55%, LV GLS average -17.1% ECHO 02/08/21 EF 55-60% GLS -13.4%. but underestimated on ECHO.      Past Medical History:  Diagnosis Date   Asthma    Breast CA ([facility])    Bronchitis    Crohn disease ([place])     Current Outpatient Medications  Medication Sig Dispense Refill   metoprolol succinate (TOPROL XL) 25 MG 24 hr tablet Take 1 tablet (25 mg total) by mouth at bedtime. 30 tablet 6   No current facility-administered medications for this encounter.    Allergies  Allergen Reactions   Bactrim [Sulfamethoxazole-Trimethoprim] Other (See Comments)    Not effective   Nickel Dermatitis      Social History   Socioeconomic History   Marital status: Single    Spouse name: Not on file   Number of children: Not on file   Years of education: Not on file   Highest education level: Not on file  Occupational History   Not on file  Tobacco Use   Smoking status: Every Day    Types: Cigarettes   Smokeless tobacco: Never   Tobacco comments:    1-2 cigarettes daily  Vaping Use   Vaping Use: Never used  Substance and Sexual Activity   Alcohol use: Yes    Comment: occ   Drug use: No   Sexual activity: Not on file  Other Topics Concern   Not  on file  Social History Narrative   Not on file   Social Determinants of Health   Financial Resource Strain: Not on file  Food Insecurity: Not on file  Transportation Needs: Not on file  Physical Activity: Not on file  Stress: Not on file  Social Connections: Not on file  Intimate Partner Violence: Not on file    Exam:  (Video/Tele Health Call; Exam is subjective and or/visual.) General:  Speaks in full sentences. No resp difficulty. Lungs: Normal respiratory effort  with conversation.  Abdomen: Non-distended per patient report Extremities: Pt denies edema. Neuro: Alert & oriented x 3. Hyper  ASSESSMENT & PLAN:  1. 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 but underestimated on ECHO.  - Echo  05/03/21 EF 55-60% GLS underestimated  - Echo 08/09/21 EF 55-60% GLS - 24%  - Continue Toprol XL 25 mg at bedtime  - Off losartan due to low BP - Echo looks good. Continue Ogivri - Repeat echo in 3 months   3. Crohn's disease - Off remicade with chemotherapy   Total time spent = 14 mins  Glori Bickers, MD  1:22 PM   [facility] [address] [place] [address] [phone] (office) [phone] (fax)

## 2021-08-16 NOTE — Patient Instructions (Signed)
Your physician recommends that you schedule a follow-up appointment in: 3 months with an echocardiogram, **PLEASE CALL OUR OFFICE IN AUGUST TO SCHEDULE THIS APPOINTMENT  If you have any questions or concerns before your next appointment please send [place] a message through [place] or call our office at [phone].    TO LEAVE A MESSAGE FOR THE NURSE SELECT OPTION 2, PLEASE LEAVE A MESSAGE INCLUDING: YOUR NAME DATE OF BIRTH CALL BACK NUMBER REASON FOR CALL**this is important as we prioritize the call backs  YOU WILL RECEIVE A CALL BACK THE SAME DAY AS LONG AS YOU CALL BEFORE 4:00 PM

## 2023-03-06 IMAGING — MG MM BREAST LOCALIZATION CLIP
4 series · 4 of 12 positions shown · non-contrast
Comparison: Previous exam(s).

CLINICAL DATA: Patient is post MRI guided biopsy of 2 sites in the
right breast including a 1.2 cm mass over the middle to anterior
third of the upper central breast as well as the anterior extent of
a 9 cm area of non mass enhancement over the outer midportion of the
right breast.

EXAM:
DIAGNOSTIC RIGHT MAMMOGRAM POST MRI BIOPSY

[R ML synth-2D]
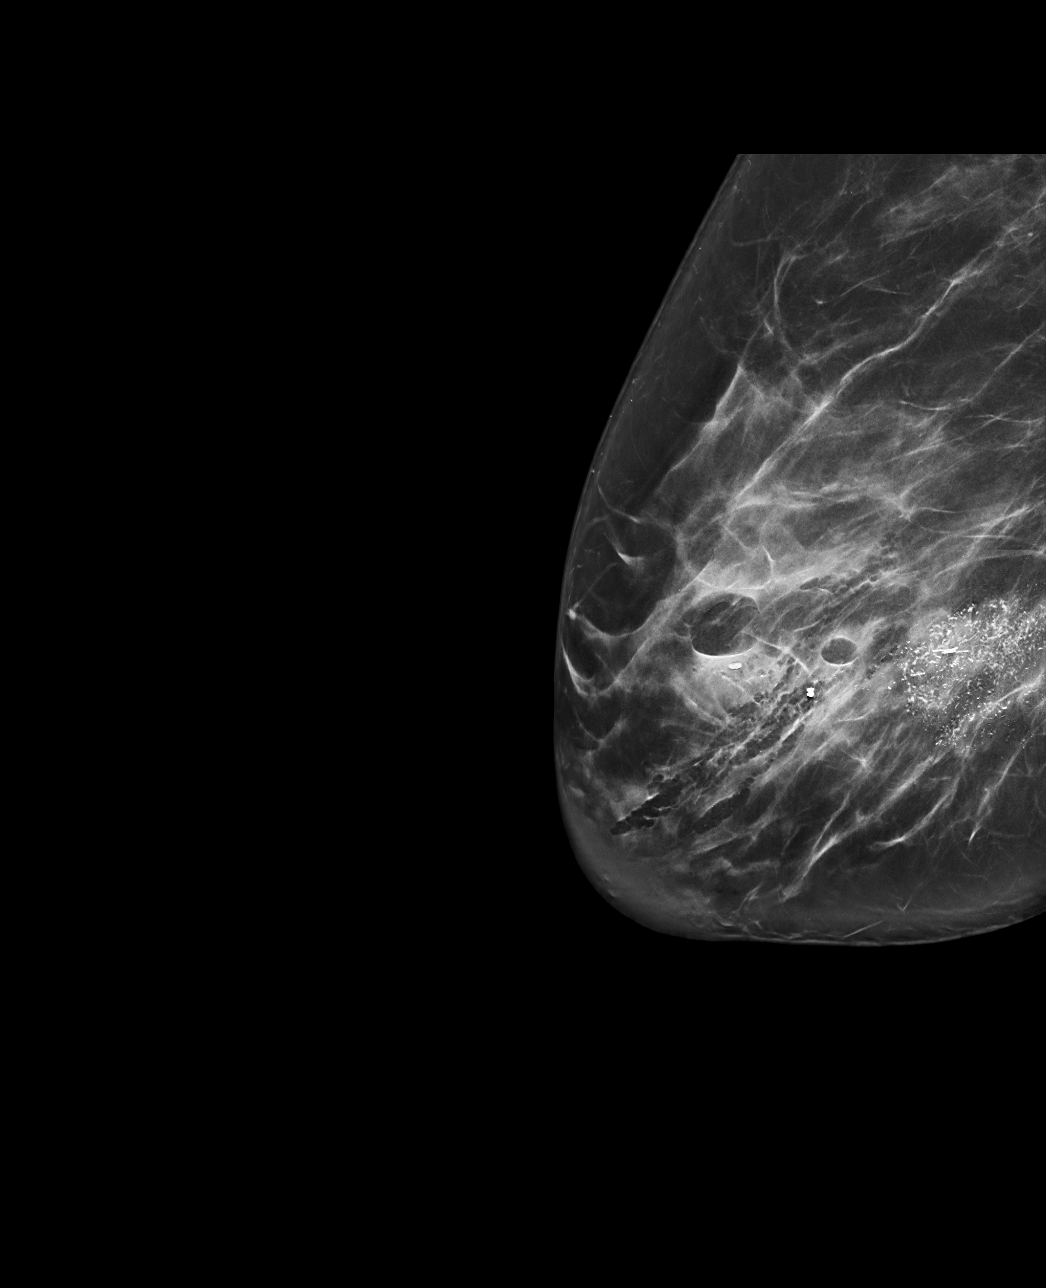

[R CC synth-2D]
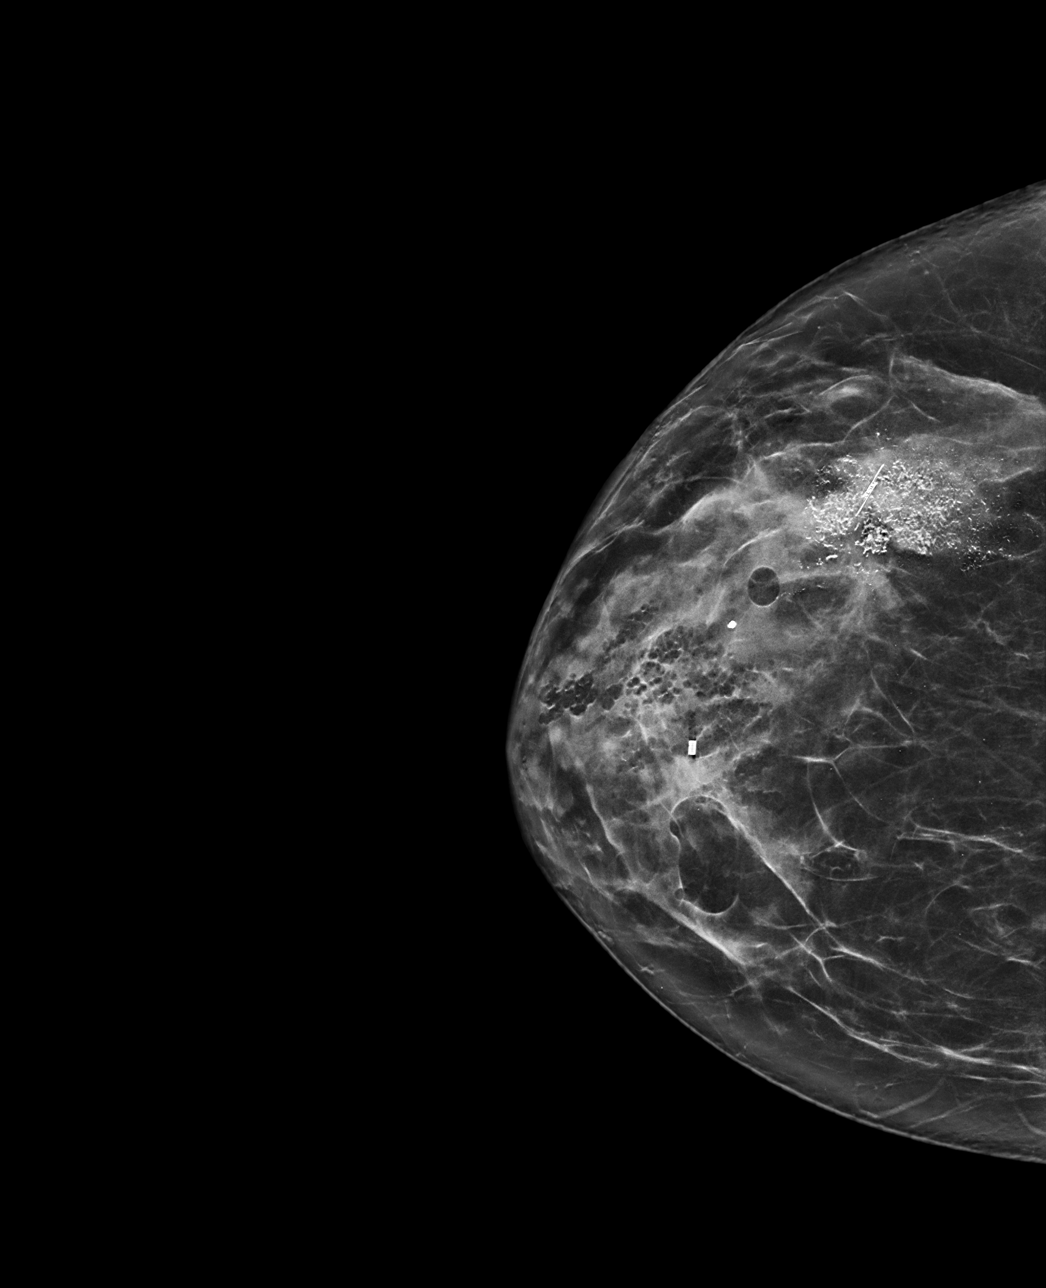

[R CC tomo · tomo slice 40/79.0]
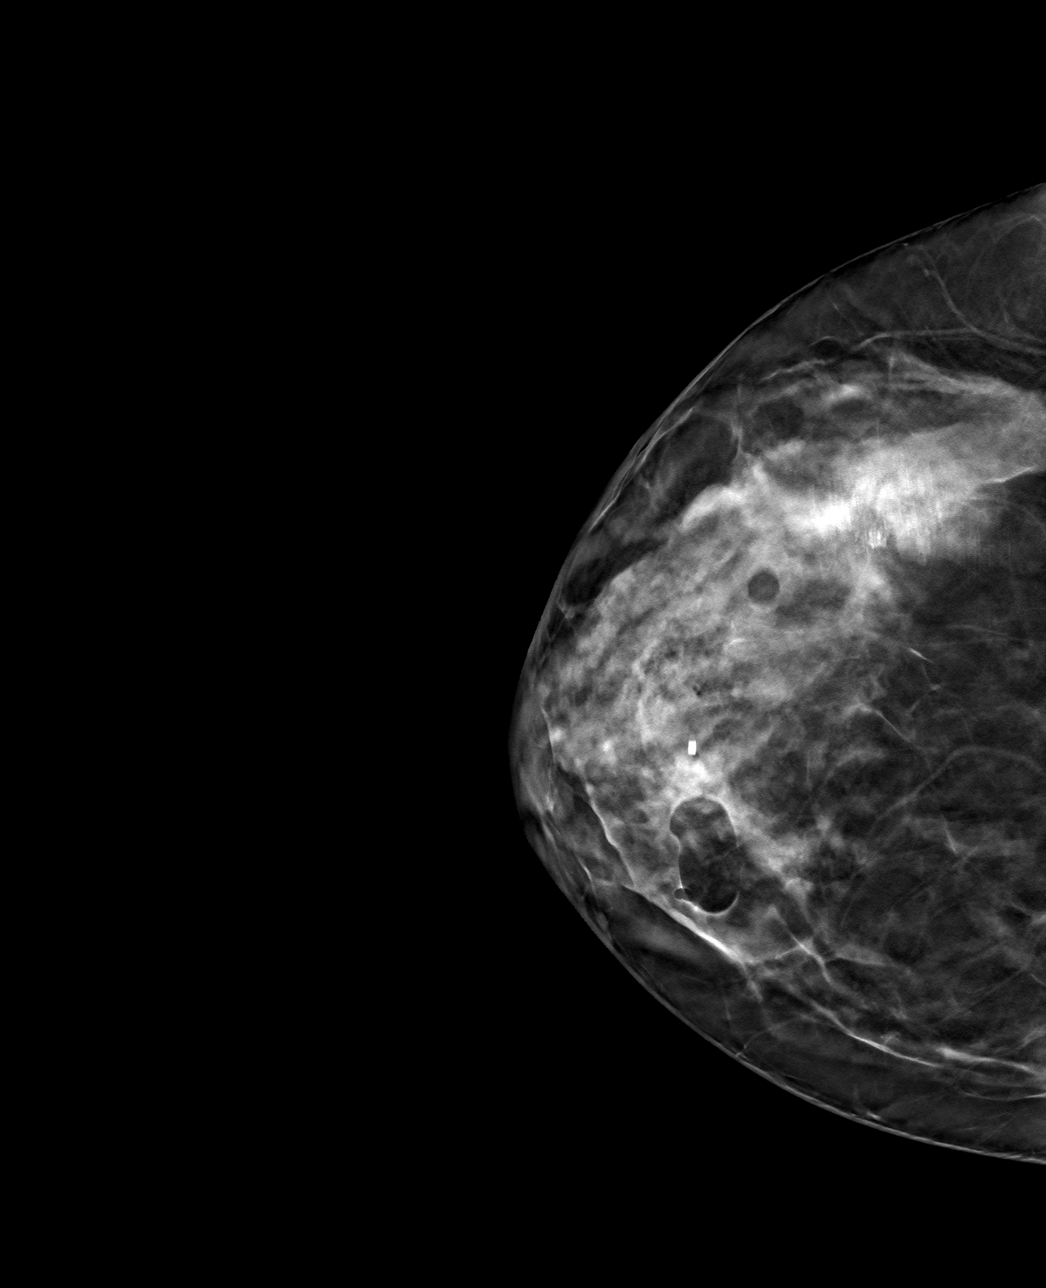

[R ML tomo · tomo slice 44/87.0]
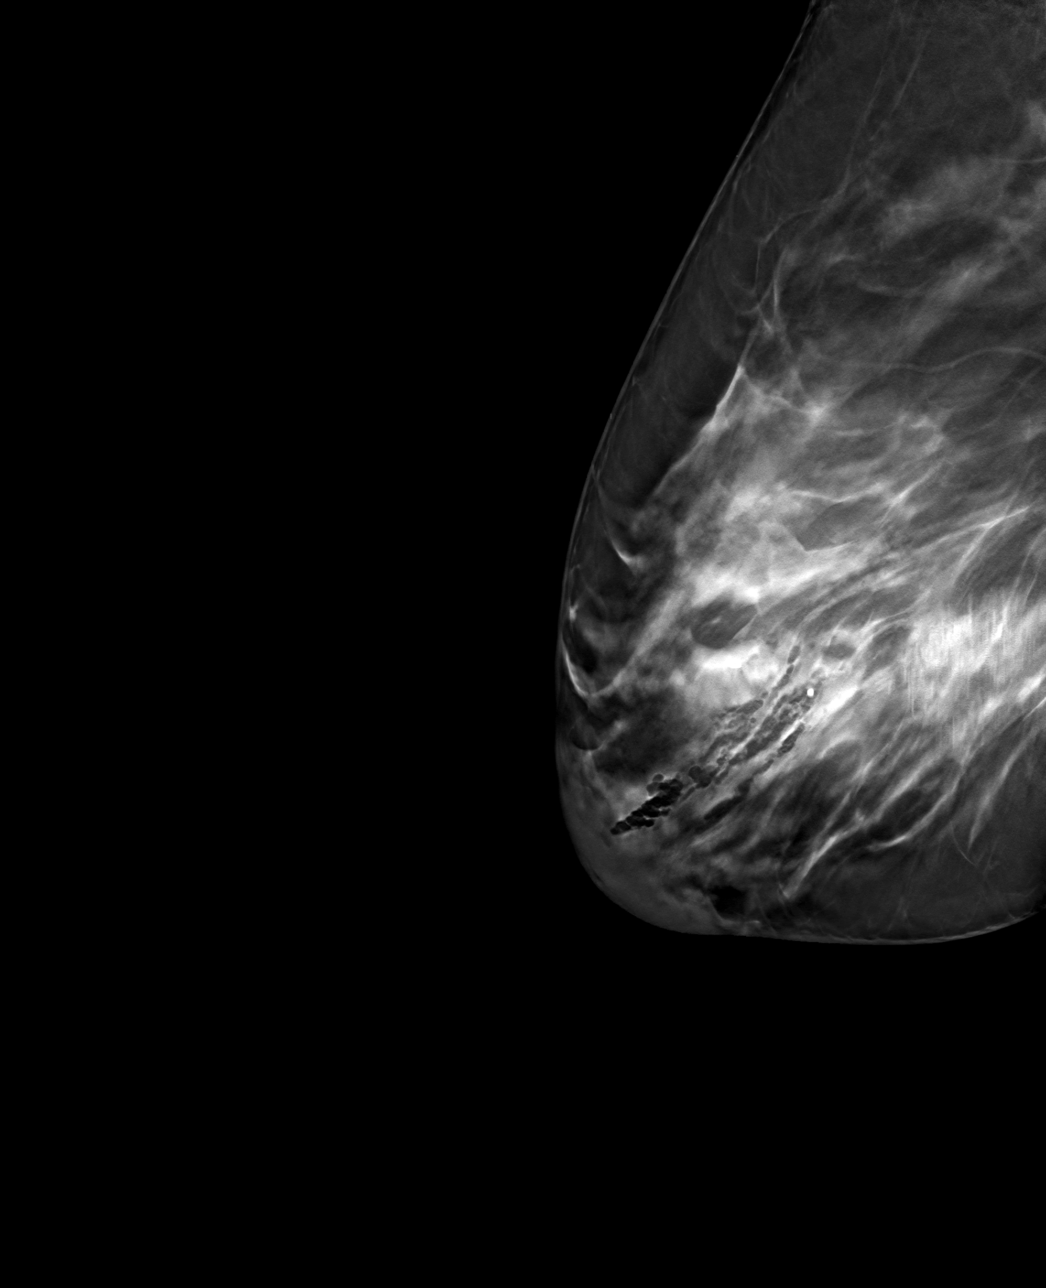

[4 of 12 positions shown; findings below may reference images not displayed]

FINDINGS: Mammographic images were obtained following MRI guided biopsy of 2
sites right breast. The cylinder shaped biopsy marking clip is in
expected position at the site of biopsy over the middle to anterior
third of the upper central breast (12 o'clock). The barbell shaped
biopsy marking clip is in the expected position at the site of
biopsy over the middle to anterior third of the outer midportion of
the right breast (9 o'clock).
IMPRESSION: 1. Appropriate positioning of the cylinder shaped biopsy marking
clip at the site of biopsy in the upper central right breast.

2. Appropriate positioning of the barbell shaped biopsy marking clip
at the site of biopsy in the outer midportion of the right breast.

Final Assessment: Post Procedure Mammograms for Marker Placement
